# Patient Record
Sex: Female | Born: 1945 | Race: White | Hispanic: Yes | Marital: Married | Smoking: Never smoker
Health system: Southern US, Community
[De-identification: ages and names within clinical notes are randomized; demographics above are authoritative.]

## PROBLEM LIST (undated history)

## (undated) DIAGNOSIS — E785 Hyperlipidemia, unspecified: Secondary | ICD-10-CM

## (undated) DIAGNOSIS — E119 Type 2 diabetes mellitus without complications: Secondary | ICD-10-CM

## (undated) DIAGNOSIS — M5030 Other cervical disc degeneration, unspecified cervical region: Secondary | ICD-10-CM

## (undated) DIAGNOSIS — K5792 Diverticulitis of intestine, part unspecified, without perforation or abscess without bleeding: Secondary | ICD-10-CM

## (undated) DIAGNOSIS — I10 Essential (primary) hypertension: Secondary | ICD-10-CM

## (undated) HISTORY — PX: KNEE SURGERY: SHX244

## (undated) HISTORY — PX: KIDNEY STONE SURGERY: SHX686

---

## 2017-07-05 DIAGNOSIS — G629 Polyneuropathy, unspecified: Secondary | ICD-10-CM

## 2017-07-05 HISTORY — DX: Polyneuropathy, unspecified: G62.9

## 2019-01-19 ENCOUNTER — Ambulatory Visit
Admission: EM | Admit: 2019-01-19 | Discharge: 2019-01-19 | Disposition: A | Discharge status: Home / Self Care | Payer: Medicare Other | Source: Home / Self Care | Attending: Family Medicine | Admitting: Family Medicine

## 2019-01-19 ENCOUNTER — Ambulatory Visit (INDEPENDENT_AMBULATORY_CARE_PROVIDER_SITE_OTHER)
Admit: 2019-01-19 | Discharge: 2019-01-19 | Disposition: A | Discharge status: Home / Self Care | Payer: Medicare Other | Attending: Family Medicine | Admitting: Family Medicine

## 2019-01-19 ENCOUNTER — Other Ambulatory Visit: Payer: Self-pay

## 2019-01-19 ENCOUNTER — Encounter: Payer: Self-pay | Admitting: Emergency Medicine

## 2019-01-19 DIAGNOSIS — Z8249 Family history of ischemic heart disease and other diseases of the circulatory system: Secondary | ICD-10-CM | POA: Diagnosis not present

## 2019-01-19 DIAGNOSIS — M4802 Spinal stenosis, cervical region: Secondary | ICD-10-CM | POA: Diagnosis not present

## 2019-01-19 DIAGNOSIS — I10 Essential (primary) hypertension: Secondary | ICD-10-CM | POA: Diagnosis not present

## 2019-01-19 DIAGNOSIS — R300 Dysuria: Secondary | ICD-10-CM | POA: Diagnosis not present

## 2019-01-19 DIAGNOSIS — Z823 Family history of stroke: Secondary | ICD-10-CM | POA: Diagnosis not present

## 2019-01-19 DIAGNOSIS — Z87442 Personal history of urinary calculi: Secondary | ICD-10-CM | POA: Diagnosis not present

## 2019-01-19 DIAGNOSIS — E785 Hyperlipidemia, unspecified: Secondary | ICD-10-CM | POA: Diagnosis not present

## 2019-01-19 DIAGNOSIS — K5732 Diverticulitis of large intestine without perforation or abscess without bleeding: Secondary | ICD-10-CM | POA: Insufficient documentation

## 2019-01-19 DIAGNOSIS — Z1159 Encounter for screening for other viral diseases: Secondary | ICD-10-CM | POA: Diagnosis not present

## 2019-01-19 DIAGNOSIS — Z882 Allergy status to sulfonamides status: Secondary | ICD-10-CM | POA: Diagnosis not present

## 2019-01-19 DIAGNOSIS — Z803 Family history of malignant neoplasm of breast: Secondary | ICD-10-CM | POA: Diagnosis not present

## 2019-01-19 DIAGNOSIS — R350 Frequency of micturition: Secondary | ICD-10-CM | POA: Diagnosis not present

## 2019-01-19 DIAGNOSIS — R1030 Lower abdominal pain, unspecified: Secondary | ICD-10-CM | POA: Diagnosis not present

## 2019-01-19 DIAGNOSIS — Z833 Family history of diabetes mellitus: Secondary | ICD-10-CM | POA: Diagnosis not present

## 2019-01-19 DIAGNOSIS — Z79899 Other long term (current) drug therapy: Secondary | ICD-10-CM | POA: Diagnosis not present

## 2019-01-19 DIAGNOSIS — R109 Unspecified abdominal pain: Secondary | ICD-10-CM | POA: Diagnosis present

## 2019-01-19 DIAGNOSIS — Z7984 Long term (current) use of oral hypoglycemic drugs: Secondary | ICD-10-CM | POA: Diagnosis not present

## 2019-01-19 DIAGNOSIS — E119 Type 2 diabetes mellitus without complications: Secondary | ICD-10-CM | POA: Diagnosis not present

## 2019-01-19 HISTORY — DX: Type 2 diabetes mellitus without complications: E11.9

## 2019-01-19 HISTORY — DX: Essential (primary) hypertension: I10

## 2019-01-19 LAB — COMPREHENSIVE METABOLIC PANEL
ALT: 15 U/L (ref 0–44)
AST: 16 U/L (ref 15–41)
Albumin: 4.1 g/dL (ref 3.5–5.0)
Alkaline Phosphatase: 87 U/L (ref 38–126)
Anion gap: 8 (ref 5–15)
BUN: 16 mg/dL (ref 8–23)
CO2: 25 mmol/L (ref 22–32)
Calcium: 9 mg/dL (ref 8.9–10.3)
Chloride: 105 mmol/L (ref 98–111)
Creatinine, Ser: 0.63 mg/dL (ref 0.44–1.00)
GFR calc Af Amer: >60 mL/min (ref >60)
GFR calc non Af Amer: >60 mL/min (ref >60)
Glucose, Bld: 142 mg/dL — ABNORMAL HIGH (ref 70–99)
Potassium: 4.1 mmol/L (ref 3.5–5.1)
Sodium: 138 mmol/L (ref 135–145)
Total Bilirubin: 0.7 mg/dL (ref 0.3–1.2)
Total Protein: 7.3 g/dL (ref 6.5–8.1)

## 2019-01-19 LAB — URINALYSIS, COMPLETE (UACMP) WITH MICROSCOPIC
Bacteria, UA: NONE SEEN
Bilirubin Urine: NEGATIVE
Glucose, UA: 500 mg/dL — AB
Hgb urine dipstick: NEGATIVE
Ketones, ur: NEGATIVE mg/dL
Leukocytes,Ua: NEGATIVE
Nitrite: NEGATIVE
Protein, ur: NEGATIVE mg/dL
RBC / HPF: NONE SEEN RBC/hpf (ref 0–5)
Specific Gravity, Urine: 1.025 (ref 1.005–1.030)
WBC, UA: NONE SEEN WBC/hpf (ref 0–5)
pH: 6 (ref 5.0–8.0)

## 2019-01-19 LAB — CBC WITH DIFFERENTIAL/PLATELET
Abs Immature Granulocytes: 0.03 10*3/uL (ref 0.00–0.07)
Basophils Absolute: 0.1 10*3/uL (ref 0.0–0.1)
Basophils Relative: 1 %
Eosinophils Absolute: 0.1 10*3/uL (ref 0.0–0.5)
Eosinophils Relative: 2 %
HCT: 37.5 % (ref 36.0–46.0)
Hemoglobin: 12.5 g/dL (ref 12.0–15.0)
Immature Granulocytes: 0 %
Lymphocytes Relative: 21 %
Lymphs Abs: 1.9 10*3/uL (ref 0.7–4.0)
MCH: 30.8 pg (ref 26.0–34.0)
MCHC: 33.3 g/dL (ref 30.0–36.0)
MCV: 92.4 fL (ref 80.0–100.0)
Monocytes Absolute: 1 10*3/uL (ref 0.1–1.0)
Monocytes Relative: 11 %
Neutro Abs: 5.7 10*3/uL (ref 1.7–7.7)
Neutrophils Relative %: 65 %
Platelets: 207 10*3/uL (ref 150–400)
RBC: 4.06 MIL/uL (ref 3.87–5.11)
RDW: 12.6 % (ref 11.5–15.5)
WBC: 8.8 10*3/uL (ref 4.0–10.5)
nRBC: 0 % (ref 0.0–0.2)

## 2019-01-19 MED ORDER — CIPROFLOXACIN HCL 500 MG PO TABS: 500.0000 mg | ORAL_TABLET | Freq: Two times a day (BID) | ORAL | 0 refills | Status: DC

## 2019-01-19 MED ORDER — IOHEXOL 300 MG/ML  SOLN
100.0000 mL | Freq: Once | INTRAMUSCULAR | Status: AC | PRN
  Administered 2019-01-19: 15:00:00 100 mL via INTRAVENOUS

## 2019-01-19 MED ORDER — METRONIDAZOLE 500 MG PO TABS: 500.0000 mg | ORAL_TABLET | Freq: Three times a day (TID) | ORAL | 0 refills | Status: DC

## 2019-01-19 NOTE — ED Provider Notes (Signed)
[facility]    CSN: [id number] Arrival date & time: 01/19/19  1325  History   Chief Complaint Chief Complaint  Patient presents with  . Abdominal Pain   HPI  73 year old female presents with abdominal pain.  Patient reports a 10-day history of lower abdominal pain.  No known inciting factor.  No fever.  No nausea, vomiting, diarrhea.  She does note urinary frequency and dysuria.  Pain is worse at night and also worse when she bends over and when she walks.  No relieving factors.  Pain is currently mild.  Intermittent.  No medications or interventions tried.  No other associated symptoms.  History reviewed and updated as below.  Past Medical History:  Diagnosis Date  . Diabetes mellitus without complication (HCC)   . Hypertension   HLD  Past Surgical History:  Procedure Laterality Date  . KNEE SURGERY Right   Surgery for Kidney stone  OB History   No obstetric history on file.    Home Medications    Prior to Admission medications   Medication Sig Start Date End Date Taking? Authorizing Provider  lisinopril (ZESTRIL) 5 MG tablet TK 1 T PO ONCE D TO LOWER BLOOD PRESSURE 12/27/18  Yes [provider]  metFORMIN (GLUCOPHAGE) 500 MG tablet TK 1 T PO QD 12/27/18  Yes [provider]  simvastatin (ZOCOR) 20 MG tablet Take by mouth.   Yes [provider]  ciprofloxacin (CIPRO) 500 MG tablet Take 1 tablet (500 mg total) by mouth 2 (two) times daily for 7 days. 01/19/19 01/26/19  Tommie Samsook, Creedence Heiss G, DO  metroNIDAZOLE (FLAGYL) 500 MG tablet Take 1 tablet (500 mg total) by mouth 3 (three) times daily for 7 days. 01/19/19 01/26/19  Tommie Samsook, Lititia Sen G, DO   Social History Social History   Tobacco Use  . Smoking status: Never Smoker  . Smokeless tobacco: Never Used  Substance Use Topics  . Alcohol use: Never    Frequency: Never  . Drug use: Never     Allergies   Sulfa antibiotics   Review of Systems Review of Systems  Constitutional: Negative for  fever.  Gastrointestinal: Negative for constipation, diarrhea, nausea and vomiting.  Genitourinary: Positive for dysuria and frequency.   Physical Exam Triage Vital Signs ED Triage Vitals  Enc Vitals Group     BP 01/19/19 1350 (!) 152/78     Pulse Rate 01/19/19 1350 68     Resp 01/19/19 1350 16     Temp 01/19/19 1350 98.3 F (36.8 C)     Temp Source 01/19/19 1350 Oral     SpO2 01/19/19 1350 98 %     Weight 01/19/19 1345 160 lb (72.6 kg)     Height 01/19/19 1345 5\' 2"  (1.575 m)     Head Circumference --      Peak Flow --      Pain Score 01/19/19 1345 2     Pain Loc --      Pain Edu? --      Excl. in GC? --    Updated Vital Signs BP (!) 152/78 (BP Location: Right Arm)   Pulse 68   Temp 98.3 F (36.8 C) (Oral)   Resp 16   Ht 5\' 2"  (1.575 m)   Wt 72.6 kg   SpO2 98%   BMI 29.26 kg/m   Visual Acuity Right Eye Distance:   Left Eye Distance:   Bilateral Distance:    Right Eye Near:   Left Eye Near:  Bilateral Near:     Physical Exam Vitals signs and nursing note reviewed.  Constitutional:      General: She is not in acute distress.    Appearance: She is well-developed. She is not ill-appearing.  HENT:     Head: Normocephalic and atraumatic.  Eyes:     General:        Right eye: No discharge.        Left eye: No discharge.     Conjunctiva/sclera: Conjunctivae normal.  Cardiovascular:     Rate and Rhythm: Normal rate and regular rhythm.     Heart sounds: Murmur present.  Pulmonary:     Effort: Pulmonary effort is normal.     Breath sounds: Normal breath sounds.  Abdominal:     Tenderness: There is no rebound.     Comments: Soft, nondistended.  Scar to the right of midline noted.  Tender to palpation in the lower abdomen, most notably the left lower quadrant.  Neurological:     Mental Status: She is alert.  Psychiatric:        Mood and Affect: Mood normal.        Behavior: Behavior normal.    UC Treatments / Results  Labs (all labs ordered are listed,  but only abnormal results are displayed) Labs Reviewed  URINALYSIS, COMPLETE (UACMP) WITH MICROSCOPIC - Abnormal; Notable for the following components:      Result Value   Glucose, UA 500 (*)    All other components within normal limits  COMPREHENSIVE METABOLIC PANEL - Abnormal; Notable for the following components:   Glucose, Bld 142 (*)    All other components within normal limits  CBC WITH DIFFERENTIAL/PLATELET    EKG   Radiology Ct Abdomen Pelvis W Contrast  Result Date: 01/19/2019 CLINICAL DATA:  Lower abdominal pain over the last 2 weeks. Concern regarding diverticulitis. EXAM: CT ABDOMEN AND PELVIS WITH CONTRAST TECHNIQUE: Multidetector CT imaging of the abdomen and pelvis was performed using the standard protocol following bolus administration of intravenous contrast. CONTRAST:  117mL OMNIPAQUE IOHEXOL 300 MG/ML  SOLN COMPARISON:  None. FINDINGS: Lower chest: Mild scarring at the lung bases. No pleural fluid. 1.6 cm pleural based nodule at the right posterior pleural surface. This is nonspecific but most likely to be benign. Pleural tumor not absolutely excluded. Is there prior imaging of the chest? If not, one might consider a dedicated chest CT as an elective procedure for further evaluation. Hepatobiliary: Fatty change of the liver. Previous cholecystectomy. 1 cm cyst or hemangioma at the dome. Pancreas: Normal Spleen: Normal Adrenals/Urinary Tract: Adrenal glands are normal. Kidneys are normal. Bladder is normal. Stomach/Bowel: No evidence of ileus or obstruction. No small bowel pathology is seen. Clip in the right lower quadrant consistent with previous appendectomy. Patient has diverticulosis of the sigmoid colon with low level diverticulitis of the proximal sigmoid region. No abscess or free air. Vascular/Lymphatic: Aortic atherosclerosis. No aneurysm. IVC is normal. No retroperitoneal adenopathy. Reproductive: Normal.  No pelvic mass. Other: No free fluid or air. Musculoskeletal:  Chronic pars defects and facet arthropathy at L5-S1 with anterolisthesis of 1 cm and chronic disc degeneration. L4-5 facet arthropathy with 5 mm of anterolisthesis and bulging of the disc. IMPRESSION: Diverticulosis of the sigmoid colon with low level diverticulitis at the proximal sigmoid region. No advanced finding. No abscess or free air. Advanced lower lumbar disc degeneration, facet degeneration and L5 pars defects. Fatty liver. Previous cholecystectomy and hysterectomy. 1.6 cm pleural based nodule at the diaphragmatic surface  on the right. I favor that this represents a benign pleural nodule, but that diagnosis is not certain. Consider complete chest CT with contrast if there is not an old study elsewhere documenting stability. Electronically Signed   By: Paulina FusiMark  Shogry M.D.   On: 01/19/2019 15:38    Procedures Procedures (including critical care time)  Medications Ordered in UC Medications - No data to display  Initial Impression / Assessment and Plan / UC Course  I have reviewed the triage vital signs and the nursing notes.  Pertinent labs & imaging results that were available during my care of the patient were reviewed by me and considered in my medical decision making (see chart for details).    73 year old female presents with lower abdominal pain.  Urinalysis not consistent with UTI.  Left lower quadrant tenderness on exam.  Discussed with the patient and her daughter that I suspect diverticulitis.  Discussed CT imaging and patient wanted to proceed with imaging to confirm diagnosis.  CT revealed low-level diverticulitis of the sigmoid colon.  Treating with Cipro and Flagyl.  Final Clinical Impressions(s) / UC Diagnoses   Final diagnoses:  Diverticulitis of colon   Discharge Instructions   None    ED Prescriptions    Medication Sig Dispense Auth. Provider   ciprofloxacin (CIPRO) 500 MG tablet Take 1 tablet (500 mg total) by mouth 2 (two) times daily for 7 days. 14 tablet Aqeel Norgaard,  Amanii Snethen G, DO   metroNIDAZOLE (FLAGYL) 500 MG tablet Take 1 tablet (500 mg total) by mouth 3 (three) times daily for 7 days. 21 tablet Tommie Samsook, Kyion Gautier G, DO     Controlled Substance Prescriptions [place] Controlled Substance Registry consulted? Not Applicable   Tommie SamsCook, Toshiko Kemler G, DO 01/19/19 1605

## 2019-01-19 NOTE — ED Notes (Signed)
Received prior authorization for patient CPT code [phone]. Authorization # [id number] obtained from [facility]. [facility]

## 2019-01-19 NOTE — ED Triage Notes (Signed)
Patient c/o lower abdominal pain that started 10 days ago.  Patient states pain is worse when she walks or bends over.  Patient denies N/V/D.  Patient reports increase in urinary frequency and some dysuria. Patient also reports numbness and tingling in her right arm for the past 3 months.

## 2019-01-22 ENCOUNTER — Inpatient Hospital Stay: Payer: Medicare Other

## 2019-01-22 ENCOUNTER — Other Ambulatory Visit: Payer: Self-pay

## 2019-01-22 ENCOUNTER — Inpatient Hospital Stay
Admission: EM | Admit: 2019-01-22 | Discharge: 2019-01-24 | DRG: 392 | Disposition: A | Discharge status: Home / Self Care | Payer: Medicare Other | Attending: Internal Medicine | Admitting: Internal Medicine

## 2019-01-22 ENCOUNTER — Emergency Department: Payer: Medicare Other

## 2019-01-22 DIAGNOSIS — Z823 Family history of stroke: Secondary | ICD-10-CM | POA: Diagnosis not present

## 2019-01-22 DIAGNOSIS — E785 Hyperlipidemia, unspecified: Secondary | ICD-10-CM | POA: Diagnosis present

## 2019-01-22 DIAGNOSIS — Z803 Family history of malignant neoplasm of breast: Secondary | ICD-10-CM

## 2019-01-22 DIAGNOSIS — R300 Dysuria: Secondary | ICD-10-CM | POA: Diagnosis not present

## 2019-01-22 DIAGNOSIS — R350 Frequency of micturition: Secondary | ICD-10-CM | POA: Diagnosis present

## 2019-01-22 DIAGNOSIS — Z87442 Personal history of urinary calculi: Secondary | ICD-10-CM | POA: Diagnosis not present

## 2019-01-22 DIAGNOSIS — M4802 Spinal stenosis, cervical region: Secondary | ICD-10-CM | POA: Diagnosis not present

## 2019-01-22 DIAGNOSIS — Z833 Family history of diabetes mellitus: Secondary | ICD-10-CM

## 2019-01-22 DIAGNOSIS — Z79899 Other long term (current) drug therapy: Secondary | ICD-10-CM | POA: Diagnosis not present

## 2019-01-22 DIAGNOSIS — Z8249 Family history of ischemic heart disease and other diseases of the circulatory system: Secondary | ICD-10-CM

## 2019-01-22 DIAGNOSIS — Z7984 Long term (current) use of oral hypoglycemic drugs: Secondary | ICD-10-CM | POA: Diagnosis not present

## 2019-01-22 DIAGNOSIS — Z1159 Encounter for screening for other viral diseases: Secondary | ICD-10-CM | POA: Diagnosis not present

## 2019-01-22 DIAGNOSIS — R109 Unspecified abdominal pain: Secondary | ICD-10-CM | POA: Diagnosis not present

## 2019-01-22 DIAGNOSIS — I10 Essential (primary) hypertension: Secondary | ICD-10-CM | POA: Diagnosis not present

## 2019-01-22 DIAGNOSIS — E119 Type 2 diabetes mellitus without complications: Secondary | ICD-10-CM | POA: Diagnosis present

## 2019-01-22 DIAGNOSIS — Z882 Allergy status to sulfonamides status: Secondary | ICD-10-CM | POA: Diagnosis not present

## 2019-01-22 DIAGNOSIS — K5732 Diverticulitis of large intestine without perforation or abscess without bleeding: Secondary | ICD-10-CM | POA: Diagnosis not present

## 2019-01-22 DIAGNOSIS — K5792 Diverticulitis of intestine, part unspecified, without perforation or abscess without bleeding: Secondary | ICD-10-CM | POA: Diagnosis present

## 2019-01-22 HISTORY — DX: Diverticulitis of intestine, part unspecified, without perforation or abscess without bleeding: K57.92

## 2019-01-22 HISTORY — DX: Hyperlipidemia, unspecified: E78.5

## 2019-01-22 LAB — CBC
HCT: 38.3 % (ref 36.0–46.0)
Hemoglobin: 12.8 g/dL (ref 12.0–15.0)
MCH: 30.5 pg (ref 26.0–34.0)
MCHC: 33.4 g/dL (ref 30.0–36.0)
MCV: 91.2 fL (ref 80.0–100.0)
Platelets: 214 10*3/uL (ref 150–400)
RBC: 4.2 MIL/uL (ref 3.87–5.11)
RDW: 12.6 % (ref 11.5–15.5)
WBC: 12.1 10*3/uL — ABNORMAL HIGH (ref 4.0–10.5)
nRBC: 0 % (ref 0.0–0.2)

## 2019-01-22 LAB — URINALYSIS, COMPLETE (UACMP) WITH MICROSCOPIC
Bacteria, UA: NONE SEEN
Bilirubin Urine: NEGATIVE
Glucose, UA: NEGATIVE mg/dL
Hgb urine dipstick: NEGATIVE
Ketones, ur: NEGATIVE mg/dL
Nitrite: NEGATIVE
Protein, ur: NEGATIVE mg/dL
Specific Gravity, Urine: 1.003 — ABNORMAL LOW (ref 1.005–1.030)
Squamous Epithelial / HPF: NONE SEEN (ref 0–5)
pH: 6 (ref 5.0–8.0)

## 2019-01-22 LAB — GLUCOSE, CAPILLARY
Glucose-Capillary: 118 mg/dL — ABNORMAL HIGH (ref 70–99)
Glucose-Capillary: 208 mg/dL — ABNORMAL HIGH (ref 70–99)

## 2019-01-22 LAB — SARS CORONAVIRUS 2 BY RT PCR (HOSPITAL ORDER, PERFORMED IN ~~LOC~~ HOSPITAL LAB): SARS Coronavirus 2: NEGATIVE

## 2019-01-22 LAB — COMPREHENSIVE METABOLIC PANEL
ALT: 17 U/L (ref 0–44)
AST: 17 U/L (ref 15–41)
Albumin: 4.5 g/dL (ref 3.5–5.0)
Alkaline Phosphatase: 83 U/L (ref 38–126)
Anion gap: 9 (ref 5–15)
BUN: 11 mg/dL (ref 8–23)
CO2: 24 mmol/L (ref 22–32)
Calcium: 9.3 mg/dL (ref 8.9–10.3)
Chloride: 106 mmol/L (ref 98–111)
Creatinine, Ser: 0.74 mg/dL (ref 0.44–1.00)
GFR calc Af Amer: >60 mL/min (ref >60)
GFR calc non Af Amer: >60 mL/min (ref >60)
Glucose, Bld: 164 mg/dL — ABNORMAL HIGH (ref 70–99)
Potassium: 4.2 mmol/L (ref 3.5–5.1)
Sodium: 139 mmol/L (ref 135–145)
Total Bilirubin: 0.7 mg/dL (ref 0.3–1.2)
Total Protein: 7.5 g/dL (ref 6.5–8.1)

## 2019-01-22 LAB — LIPASE, BLOOD: Lipase: 30 U/L (ref 11–51)

## 2019-01-22 MED ORDER — LISINOPRIL 10 MG PO TABS
5.0000 mg | ORAL_TABLET | Freq: Every day | ORAL | Status: DC
  Filled 2019-01-22: qty 1

## 2019-01-22 MED ORDER — ONDANSETRON HCL 4 MG/2ML IJ SOLN
4.0000 mg | Freq: Once | INTRAMUSCULAR | Status: AC
  Administered 2019-01-22: 4 mg via INTRAVENOUS
  Filled 2019-01-22: qty 2

## 2019-01-22 MED ORDER — IOHEXOL 240 MG/ML SOLN
25.0000 mL | INTRAMUSCULAR | Status: AC
  Administered 2019-01-22: 25 mL via ORAL

## 2019-01-22 MED ORDER — PIPERACILLIN-TAZOBACTAM 3.375 G IVPB 30 MIN
3.3750 g | Freq: Once | INTRAVENOUS | Status: AC
  Administered 2019-01-22: 3.375 g via INTRAVENOUS
  Filled 2019-01-22: qty 50

## 2019-01-22 MED ORDER — IOHEXOL 300 MG/ML  SOLN
100.0000 mL | Freq: Once | INTRAMUSCULAR | Status: AC | PRN
  Administered 2019-01-22: 100 mL via INTRAVENOUS

## 2019-01-22 MED ORDER — FENTANYL CITRATE (PF) 100 MCG/2ML IJ SOLN
50.0000 ug | Freq: Once | INTRAMUSCULAR | Status: AC
  Administered 2019-01-22: 50 ug via INTRAVENOUS
  Filled 2019-01-22: qty 2

## 2019-01-22 MED ORDER — PIPERACILLIN-TAZOBACTAM 3.375 G IVPB
3.3750 g | Freq: Three times a day (TID) | INTRAVENOUS | Status: DC
  Administered 2019-01-23 – 2019-01-24 (×4): 3.375 g via INTRAVENOUS
  Filled 2019-01-22 (×4): qty 50

## 2019-01-22 MED ORDER — ACETAMINOPHEN 325 MG PO TABS
650.0000 mg | ORAL_TABLET | Freq: Four times a day (QID) | ORAL | Status: DC | PRN
  Administered 2019-01-23 (×2): 650 mg via ORAL
  Filled 2019-01-22 (×2): qty 2

## 2019-01-22 MED ORDER — ONDANSETRON HCL 4 MG/2ML IJ SOLN: 4.0000 mg | Freq: Four times a day (QID) | INTRAMUSCULAR | Status: DC | PRN

## 2019-01-22 MED ORDER — ENOXAPARIN SODIUM 40 MG/0.4ML ~~LOC~~ SOLN
40.0000 mg | SUBCUTANEOUS | Status: DC
  Administered 2019-01-22 – 2019-01-23 (×2): 40 mg via SUBCUTANEOUS
  Filled 2019-01-22 (×2): qty 0.4

## 2019-01-22 MED ORDER — OXYCODONE HCL 5 MG PO TABS: 5.0000 mg | ORAL_TABLET | ORAL | Status: DC | PRN

## 2019-01-22 MED ORDER — SODIUM CHLORIDE 0.9 % IV BOLUS
1000.0000 mL | Freq: Once | INTRAVENOUS | Status: AC
  Administered 2019-01-22: 1000 mL via INTRAVENOUS

## 2019-01-22 MED ORDER — ALBUTEROL SULFATE (2.5 MG/3ML) 0.083% IN NEBU: 3.0000 mL | INHALATION_SOLUTION | RESPIRATORY_TRACT | Status: DC | PRN

## 2019-01-22 MED ORDER — SIMVASTATIN 20 MG PO TABS
20.0000 mg | ORAL_TABLET | Freq: Every day | ORAL | Status: DC
  Administered 2019-01-23: 20 mg via ORAL
  Filled 2019-01-22 (×2): qty 1

## 2019-01-22 MED ORDER — METFORMIN HCL 500 MG PO TABS
500.0000 mg | ORAL_TABLET | Freq: Every day | ORAL | Status: DC
  Administered 2019-01-23 – 2019-01-24 (×2): 500 mg via ORAL
  Filled 2019-01-22 (×2): qty 1

## 2019-01-22 MED ORDER — LORAZEPAM 2 MG/ML IJ SOLN
0.5000 mg | Freq: Once | INTRAMUSCULAR | Status: AC | PRN
  Administered 2019-01-22: 0.5 mg via INTRAVENOUS
  Filled 2019-01-22: qty 1

## 2019-01-22 MED ORDER — ACETAMINOPHEN 650 MG RE SUPP: 650.0000 mg | Freq: Four times a day (QID) | RECTAL | Status: DC | PRN

## 2019-01-22 MED ORDER — ONDANSETRON HCL 4 MG PO TABS: 4.0000 mg | ORAL_TABLET | Freq: Four times a day (QID) | ORAL | Status: DC | PRN

## 2019-01-22 NOTE — H&P (Signed)
[place] [facility] [place] NAME: Pamela Rios    MR#:  [id number]  DATE OF BIRTH:  06-24-1946  DATE OF ADMISSION:  01/22/2019  PRIMARY CARE PHYSICIAN: Patient, No Pcp Per   REQUESTING/REFERRING PHYSICIAN: Dr Charlotte Crumb  CHIEF COMPLAINT:   Chief Complaint  Patient presents with  . Abdominal Pain  . Dizziness    HISTORY OF PRESENT ILLNESS:  Pamela Rios  is a 73 y.o. female with recently diagnosed diverticulitis.  Patient is having 2 weeks of abdominal pain in the left lower quadrant.  She went to the urgent care on Friday was diagnosed with diverticulitis and given Cipro and Flagyl.  The pain kept on getting worse and worse and no relief.  Pain was 1010 when she came in now 7 out of 10 after some IV medications.  She did vomit today.  Some chills.  Some nausea.  Normal bowel movements.  No diarrhea or constipation.  No fever.  In the ER she still had diverticulitis on CT scan and hospitalist services were contacted for further evaluation.  Of note patient has had right arm numbness for the past 2 months worse with movement of her arm and neck.  PAST MEDICAL HISTORY:   Past Medical History:  Diagnosis Date  . Diabetes mellitus without complication ([place])   . Diverticulitis   . Hyperlipidemia   . Hypertension     PAST SURGICAL HISTORY:   Past Surgical History:  Procedure Laterality Date  . KIDNEY STONE SURGERY    . KNEE SURGERY Right     SOCIAL HISTORY:   Social History   Tobacco Use  . Smoking status: Never Smoker  . Smokeless tobacco: Never Used  Substance Use Topics  . Alcohol use: Never    Frequency: Never    FAMILY HISTORY:   Family History  Problem Relation Age of Onset  . Breast cancer Mother   . CVA Mother   . Diabetes Mother   . Hypertension Mother   . CAD Father     DRUG ALLERGIES:   Allergies  Allergen Reactions  . Sulfa Antibiotics Rash    REVIEW OF SYSTEMS:  CONSTITUTIONAL: No fever,  positive for chills and sweats.  Positive for weight gain.  Positive for fatigue.  EYES: No blurred or double vision.  EARS, NOSE, AND THROAT: No tinnitus or ear pain. No sore throat RESPIRATORY: No cough, shortness of breath, wheezing or hemoptysis.  CARDIOVASCULAR: No chest pain, orthopnea, edema.  GASTROINTESTINAL: No nausea, vomiting, and abdominal pain. No blood in bowel movements.  No diarrhea or constipation. GENITOURINARY: No dysuria, hematuria.  ENDOCRINE: No polyuria, nocturia,  HEMATOLOGY: No anemia, easy bruising or bleeding SKIN: No rash or lesion. MUSCULOSKELETAL: No joint pain or arthritis.   NEUROLOGIC: Right arm numbness PSYCHIATRY: No anxiety or depression.   MEDICATIONS AT HOME:   Prior to Admission medications   Medication Sig Start Date End Date Taking? Authorizing Provider  albuterol (VENTOLIN HFA) 108 (90 Base) MCG/ACT inhaler Inhale 2 puffs into the lungs every 4 (four) hours as needed for wheezing. 08/30/18  Yes [provider]  ciprofloxacin (CIPRO) 500 MG tablet Take 1 tablet (500 mg total) by mouth 2 (two) times daily for 7 days. 01/19/19 01/26/19 Yes Cook, Jayce G, DO  lisinopril (ZESTRIL) 5 MG tablet Take 5 mg by mouth daily.  12/27/18  Yes [provider]  metFORMIN (GLUCOPHAGE) 500 MG tablet Take 500 mg by mouth daily with breakfast.  12/27/18  Yes  [provider]  metroNIDAZOLE (FLAGYL) 500 MG tablet Take 1 tablet (500 mg total) by mouth 3 (three) times daily for 7 days. 01/19/19 01/26/19 Yes Cook, Jayce G, DO  simvastatin (ZOCOR) 20 MG tablet Take 20 mg by mouth daily at 6 PM.    Yes [provider]      VITAL SIGNS:  Blood pressure (!) 146/65, pulse 61, temperature 98.9 F (37.2 C), temperature source Oral, resp. rate 16, height 5\' 2"  (1.575 m), weight 72.6 kg, SpO2 98 %.  PHYSICAL EXAMINATION:  GENERAL:  73 y.o.-year-old patient lying in the bed with no acute distress.  EYES: Pupils equal, round, reactive to light and  accommodation. No scleral icterus. Extraocular muscles intact.  HEENT: Head atraumatic, normocephalic. Oropharynx and nasopharynx clear.  NECK:  Supple, no jugular venous distention. No thyroid enlargement, no tenderness.  LUNGS: Normal breath sounds bilaterally, no wheezing, rales,rhonchi or crepitation. No use of accessory muscles of respiration.  CARDIOVASCULAR: S1, S2 normal. No murmurs, rubs, or gallops.  ABDOMEN: Soft, nontender, nondistended. Bowel sounds present. No organomegaly or mass.  EXTREMITIES: No pedal edema, cyanosis, or clubbing.  NEUROLOGIC: Cranial nerves II through XII are intact. Muscle strength 5/5 in all extremities. Sensation intact. Gait not checked.  PSYCHIATRIC: The patient is alert and oriented x 3.  SKIN: No rash, lesion, or ulcer.   LABORATORY PANEL:   CBC Recent Labs  Lab 01/22/19 1458  WBC 12.1*  HGB 12.8  HCT 38.3  PLT 214   ------------------------------------------------------------------------------------------------------------------  Chemistries  Recent Labs  Lab 01/22/19 1458  NA 139  K 4.2  CL 106  CO2 24  GLUCOSE 164*  BUN 11  CREATININE 0.74  CALCIUM 9.3  AST 17  ALT 17  ALKPHOS 83  BILITOT 0.7   ------------------------------------------------------------------------------------------------------------------  RADIOLOGY:  Ct Abdomen Pelvis W Contrast  Result Date: 01/22/2019 CLINICAL DATA:  73 year old with chronic LEFT LOWER QUADRANT abdominal pain and diagnosis of low-grade acute diverticulitis 3 days ago, presenting now with worsening pain despite antibiotic therapy with ciprofloxacin and Flagyl. EXAM: CT ABDOMEN AND PELVIS WITH CONTRAST TECHNIQUE: Multidetector CT imaging of the abdomen and pelvis was performed using the standard protocol following bolus administration of intravenous contrast. CONTRAST:  100mL OMNIPAQUE IOHEXOL 300 MG/ML IV. Oral contrast was also administered. COMPARISON:  01/19/2019. FINDINGS: Lower chest:  Mild dependent atelectasis in the lower lobes. Visualized lung bases otherwise clear. Heart moderately enlarged, unchanged. Hepatobiliary: Mild diffuse hepatic steatosis as noted 3 days ago. A focal eventration of the RIGHT POSTERIOR hemidiaphragm is favored over a pleural nodule at the base of the RIGHT hemithorax. Benign cyst involving the MEDIAL segment LEFT lobe of liver. No significant focal hepatic parenchymal abnormality. Surgically absent gallbladder. No unexpected biliary ductal dilation. Pancreas: Normal in appearance without evidence of mass, ductal dilation, or inflammation. Spleen: Normal in size and appearance. Adrenals/Urinary Tract: Normal appearing adrenal glands. Kidneys normal in size and appearance without focal parenchymal abnormality. No hydronephrosis. No evidence of urinary tract calculi. Normal appearing urinary bladder. Stomach/Bowel: Stomach normal in appearance for the degree of distention. Normal-appearing small bowel. Interval progression of acute inflammatory changes involving the proximal sigmoid colon since the CT 3 days ago. No extraluminal gas or abnormal fluid collection. No new areas of diverticulitis. Surgical clip at the cecal tip presumably related to prior appendectomy. Vascular/Lymphatic: Mild aortoiliac atherosclerosis without evidence of aneurysm. Normal-appearing portal venous and systemic venous systems. No pathologic lymphadenopathy. Reproductive: Normal-appearing uterus and ovaries without evidence of adnexal mass. Other: Numerous pelvic phleboliths.  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 BILATERAL L5 pars defects and grade 2 spondylolisthesis of L5 on S1 measuring approximately 9 mm. Facet degenerative changes at L4-5 and L5-S1. Degenerative disc disease and spondylosis involving the LOWER thoracic spine. No acute findings. IMPRESSION: 1. Mild interval progression of acute diverticulitis involving the proximal sigmoid colon since  the CT 3 days ago. No evidence of perforation or abscess. 2. No acute abnormalities otherwise involving the abdomen or pelvis. 3. Mild diffuse hepatic steatosis. 4. BILATERAL L5 pars defects and grade 2 spondylolisthesis of L5 on S1 measuring approximately 9 mm. Aortic Atherosclerosis (ICD10-I70.0). Electronically Signed   By: Hulan Saashomas  Lawrence M.D.   On: 01/22/2019 19:38    EKG:   Normal sinus rhythm 82 bpm, left axis deviation left bundle branch block  IMPRESSION AND PLAN:   1.  Acute diverticulitis failed outpatient treatment treatment with Cipro and Flagyl.  We will give IV Zosyn.  Pain control.  Nausea control. 2.  Numbness of the right arm.  We will get a MRI of the cervical spine and brain to rule out stroke or herniated disc. 3.  Type 2 diabetes mellitus on Glucophage and sliding scale 4.  Hypertension on low-dose lisinopril 5.  Hyperlipidemia unspecified on Zocor  All the records are reviewed and case discussed with ED provider. Management plans discussed with the patient, family and they are in agreement.  CODE STATUS: Full code  TOTAL TIME TAKING CARE OF THIS PATIENT: 50 minutes.    Alford Highlandichard Harlo Fabela M.D on 01/22/2019 at 8:34 PM  Between 7am to 6pm - Pager - [phone]  After 6pm call admission pager [phone]  [facility]  [phone]  CC: Primary care physician; Patient, No Pcp Per

## 2019-01-22 NOTE — ED Notes (Signed)
Sandwich tray was given to pt.

## 2019-01-22 NOTE — ED Notes (Signed)
Patient had large, bile-colored emesis. Dr. Burlene Arnt aware.

## 2019-01-22 NOTE — ED Triage Notes (Addendum)
C/o lower abdominal pain X 2 weeks, went to Urgent Care on Friday and dx with diverticulitis- given prescriptions for flagyl and cipro. Reports dysuria and pain with urination. Reports feeling hot and dizziness. Pt alert and oriented X4, active, cooperative, pt in NAD. RR even and unlabored, color WNL.

## 2019-01-22 NOTE — Consult Note (Signed)
Pharmacy Antibiotic Note  Pamela Rios is a 73 y.o. female admitted on 01/22/2019 with Intra-abdominal Infection. It was noted that patient had a large, bile-colored emesis. Zosyn was given in the ED today @ 1717.  Pharmacy has been consulted for Zosyn dosing.    Plan: Zosyn 3.375 g Q8H (extended infusion) - next dose due @ 0100  Height: 5\' 2"  (157.5 cm) Weight: 160 lb (72.6 kg) IBW/kg (Calculated) : 50.1  Temp (24hrs), Avg:98.9 F (37.2 C), Min:98.9 F (37.2 C), Max:98.9 F (37.2 C)  Recent Labs  Lab 01/19/19 1453 01/22/19 1458  WBC 8.8 12.1*  CREATININE 0.63 0.74    Estimated Creatinine Clearance: 61 mL/min (by C-G formula based on SCr of 0.74 mg/dL).    Allergies  Allergen Reactions  . Sulfa Antibiotics Rash    Antimicrobials this admission: 07/20 Zosyn >>  Dose adjustments this admission: N/A  Microbiology results: N/A  Thank you for allowing pharmacy to be a part of this patient's care.  Rowland Lathe 01/22/2019 8:34 PM

## 2019-01-22 NOTE — ED Provider Notes (Signed)
[facility] Emergency Department Provider Note  ____________________________________________   I have reviewed the triage vital signs and the nursing notes. Where available I have reviewed prior notes and, if possible and indicated, outside hospital notes.   Patient seen and evaluated during the coronavirus epidemic during a time with low staffing HISTORY  Chief Complaint Abdominal Pain and Dizziness    HPI Pamela Rios is a 73 y.o. female  History of diabetes mellitus and hypertension, diagnosed on the 17th of this month with diverticulitis after several weeks of abdominal pain on the left side.  No melena no bright red blood per rectum, no vomiting.  Positive nausea.  Feels warm but no fever.  States the pain is getting worse and not better despite the fact that she is on Cipro and Flagyl since Friday.  She states the pain is keeping her up at night.  Is worse when she moves around.  No other alleviating or aggravating features no other complaints except for she does have a sense that when she really has to urinate it makes the pain worse but she does not have dysuria per se      Past Medical History:  Diagnosis Date  . Diabetes mellitus without complication (HCC)   . Diverticulitis   . Hypertension     There are no active problems to display for this patient.   Past Surgical History:  Procedure Laterality Date  . KNEE SURGERY Right     Prior to Admission medications   Medication Sig Start Date End Date Taking? Authorizing Provider  ciprofloxacin (CIPRO) 500 MG tablet Take 1 tablet (500 mg total) by mouth 2 (two) times daily for 7 days. 01/19/19 01/26/19  Tommie Samsook, Jayce G, DO  lisinopril (ZESTRIL) 5 MG tablet TK 1 T PO ONCE D TO LOWER BLOOD PRESSURE 12/27/18   [provider]  metFORMIN (GLUCOPHAGE) 500 MG tablet TK 1 T PO QD 12/27/18   [provider]  metroNIDAZOLE (FLAGYL) 500 MG tablet Take 1 tablet (500 mg total) by mouth 3 (three)  times daily for 7 days. 01/19/19 01/26/19  Tommie Samsook, Jayce G, DO  simvastatin (ZOCOR) 20 MG tablet Take by mouth.    [provider]    Allergies Sulfa antibiotics  No family history on file.  Social History Social History   Tobacco Use  . Smoking status: Never Smoker  . Smokeless tobacco: Never Used  Substance Use Topics  . Alcohol use: Never    Frequency: Never  . Drug use: Never    Review of Systems Constitutional: No fever/chills Eyes: No visual changes. ENT: No sore throat. No stiff neck no neck pain Cardiovascular: Denies chest pain. Respiratory: Denies shortness of breath. Gastrointestinal:   no vomiting.  No diarrhea.  No constipation. Genitourinary: Negative for dysuria. Musculoskeletal: Negative lower extremity swelling Skin: Negative for rash. Neurological: Negative for severe headaches, focal weakness or numbness.   ____________________________________________   PHYSICAL EXAM:  VITAL SIGNS: ED Triage Vitals  Enc Vitals Group     BP 01/22/19 1449 (!) 153/74     Pulse Rate 01/22/19 1449 81     Resp 01/22/19 1449 18     Temp 01/22/19 1449 98.9 F (37.2 C)     Temp Source 01/22/19 1449 Oral     SpO2 01/22/19 1449 95 %     Weight 01/22/19 1450 160 lb (72.6 kg)     Height 01/22/19 1450 5\' 2"  (1.575 m)     Head Circumference --  Peak Flow --      Pain Score 01/22/19 1453 9     Pain Loc --      Pain Edu? --      Excl. in GC? --     Constitutional: Alert and oriented. Well appearing and in no acute distress. Eyes: Conjunctivae are normal Head: Atraumatic HEENT: No congestion/rhinnorhea. Mucous membranes are moist.  Oropharynx non-erythematous Neck:   Nontender with no meningismus, no masses, no stridor Cardiovascular: Normal rate, regular rhythm. Grossly normal heart sounds.  Good peripheral circulation. Respiratory: Normal respiratory effort.  No retractions. Lungs CTAB. Abdominal: Soft and tenderness palpation left lower quadrant, positive  guarding, no rebound yet. No distention.Back:  There is no focal tenderness or step off.  there is no midline tenderness there are no lesions noted. there is no CVA tenderness Musculoskeletal: No lower extremity tenderness, no upper extremity tenderness. No joint effusions, no DVT signs strong distal pulses no edema Neurologic:  Normal speech and language. No gross focal neurologic deficits are appreciated.  Skin:  Skin is warm, dry and intact. No rash noted. Psychiatric: Mood and affect are normal. Speech and behavior are normal.  ____________________________________________   LABS (all labs ordered are listed, but only abnormal results are displayed)  Labs Reviewed  COMPREHENSIVE METABOLIC PANEL - Abnormal; Notable for the following components:      Result Value   Glucose, Bld 164 (*)    All other components within normal limits  CBC - Abnormal; Notable for the following components:   WBC 12.1 (*)    All other components within normal limits  URINALYSIS, COMPLETE (UACMP) WITH MICROSCOPIC - Abnormal; Notable for the following components:   Color, Urine STRAW (*)    APPearance CLEAR (*)    Specific Gravity, Urine 1.003 (*)    Leukocytes,Ua TRACE (*)    All other components within normal limits  LIPASE, BLOOD    Pertinent labs  results that were available during my care of the patient were reviewed by me and considered in my medical decision making (see chart for details). ____________________________________________  EKG  I personally interpreted any EKGs ordered by me or triage  ____________________________________________  RADIOLOGY  Pertinent labs & imaging results that were available during my care of the patient were reviewed by me and considered in my medical decision making (see chart for details). If possible, patient and/or family made aware of any abnormal findings.  No results found. ____________________________________________    PROCEDURES  Procedure(s)  performed: None  Procedures  Critical Care performed: None  ____________________________________________   INITIAL IMPRESSION / ASSESSMENT AND PLAN / ED COURSE  Pertinent labs & imaging results that were available during my care of the patient were reviewed by me and considered in my medical decision making (see chart for details).  Seen with left lower quadrant abdominal pain, known history of diverticulitis however getting worse and not better with a white count going up and increased pain with very focal and not in significant discomfort, my concern is for either perforation or abscess, we will obtain repeat CT scan with some reluctance.  Blood work is reassuring.  I will also administer IV Zosyn as we do know what the underlying pathology appears to be, and we will give her pain medication and IV fluid and reassess    ____________________________________________   FINAL CLINICAL IMPRESSION(S) / ED DIAGNOSES  Final diagnoses:  None      This chart was dictated using voice recognition software.  Despite best efforts to  proofread,  errors can occur which can change meaning.        Schuyler Amor, MD 01/22/19 [phone]

## 2019-01-22 NOTE — ED Notes (Signed)
Patient taken to MRI

## 2019-01-22 NOTE — ED Notes (Signed)
Patient taken to CT scan.

## 2019-01-23 DIAGNOSIS — R109 Unspecified abdominal pain: Secondary | ICD-10-CM | POA: Diagnosis not present

## 2019-01-23 DIAGNOSIS — K5732 Diverticulitis of large intestine without perforation or abscess without bleeding: Secondary | ICD-10-CM | POA: Diagnosis not present

## 2019-01-23 LAB — BASIC METABOLIC PANEL
Anion gap: 9 (ref 5–15)
BUN: 10 mg/dL (ref 8–23)
CO2: 26 mmol/L (ref 22–32)
Calcium: 8.9 mg/dL (ref 8.9–10.3)
Chloride: 108 mmol/L (ref 98–111)
Creatinine, Ser: 0.71 mg/dL (ref 0.44–1.00)
GFR calc Af Amer: >60 mL/min (ref >60)
GFR calc non Af Amer: >60 mL/min (ref >60)
Glucose, Bld: 112 mg/dL — ABNORMAL HIGH (ref 70–99)
Potassium: 3.7 mmol/L (ref 3.5–5.1)
Sodium: 143 mmol/L (ref 135–145)

## 2019-01-23 LAB — CBC
HCT: 37.1 % (ref 36.0–46.0)
Hemoglobin: 12.3 g/dL (ref 12.0–15.0)
MCH: 30.7 pg (ref 26.0–34.0)
MCHC: 33.2 g/dL (ref 30.0–36.0)
MCV: 92.5 fL (ref 80.0–100.0)
Platelets: 204 10*3/uL (ref 150–400)
RBC: 4.01 MIL/uL (ref 3.87–5.11)
RDW: 12.8 % (ref 11.5–15.5)
WBC: 9 10*3/uL (ref 4.0–10.5)
nRBC: 0 % (ref 0.0–0.2)

## 2019-01-23 LAB — HEMOGLOBIN A1C
Hgb A1c MFr Bld: 6.5 % — ABNORMAL HIGH (ref 4.8–5.6)
Mean Plasma Glucose: 139.85 mg/dL

## 2019-01-23 LAB — GLUCOSE, CAPILLARY
Glucose-Capillary: 122 mg/dL — ABNORMAL HIGH (ref 70–99)
Glucose-Capillary: 118 mg/dL — ABNORMAL HIGH (ref 70–99)
Glucose-Capillary: 111 mg/dL — ABNORMAL HIGH (ref 70–99)

## 2019-01-23 MED ORDER — LISINOPRIL 10 MG PO TABS
5.0000 mg | ORAL_TABLET | Freq: Every day | ORAL | Status: DC
  Administered 2019-01-23: 5 mg via ORAL
  Filled 2019-01-23: qty 1

## 2019-01-23 MED ORDER — SODIUM CHLORIDE 0.9 % IV SOLN
INTRAVENOUS | Status: DC | PRN
  Administered 2019-01-23: 250 mL via INTRAVENOUS

## 2019-01-23 MED ORDER — INSULIN ASPART 100 UNIT/ML ~~LOC~~ SOLN: 0.0000 [IU] | Freq: Three times a day (TID) | SUBCUTANEOUS | Status: DC

## 2019-01-23 MED ORDER — INSULIN ASPART 100 UNIT/ML ~~LOC~~ SOLN: 0.0000 [IU] | Freq: Every day | SUBCUTANEOUS | Status: DC

## 2019-01-23 NOTE — Consult Note (Signed)
Neurosurgery-New Consultation Evaluation 01/23/2019 Pamela Pamela Rios Pamela Rios [id number]  Identifying Statement: Pamela Pamela Rios Pamela Pamela Rios is a 73 [place] [id number] with right arm numbness  Physician Requesting Consultation: Enedina FinnerSona Patel, MD  History of Present Illness: Ms. Pamela Pamela Rios is admitted with concern for diverticulitis which is being treated.  She was complaining of right arm numbness that happened approximate 2 months ago without any obvious etiology.  She denies any pain down her arm but does have some pain in the right side of her neck.  She denies any obvious weakness.  She is right-handed.  She denies any left arm symptoms.  She is not endorse any history of similar symptoms like this in the past.  She had not undergone any treatment prior to this hospitalization for this.  MRI of the brain and cervical spine were obtained for evaluation.  Past Medical History:  Past Medical History:  Diagnosis Date  . Diabetes mellitus without complication (HCC)   . Diverticulitis   . Hyperlipidemia   . Hypertension     Social History: Social History   Socioeconomic History  . Marital status: Married    Spouse name: Not on file  . Number of children: Not on file  . Years of education: Not on file  . Highest education level: Not on file  Occupational History  . Not on file  Social Needs  . Financial resource strain: Not on file  . Food insecurity    Worry: Not on file    Inability: Not on file  . Transportation needs    Medical: Not on file    Non-medical: Not on file  Tobacco Use  . Smoking status: Never Smoker  . Smokeless tobacco: Never Used  Substance and Sexual Activity  . Alcohol use: Never    Frequency: Never  . Drug use: Never  . Sexual activity: Not on file  Lifestyle  . Physical activity    Days per week: Not on file    Minutes per session: Not on file  . Stress: Not on file  Relationships  . Social Musicianconnections    Talks on phone: Not on file    Gets together: Not on file     Attends religious service: Not on file    Active member of club or organization: Not on file    Attends meetings of clubs or organizations: Not on file    Relationship status: Not on file  . Intimate partner violence    Fear of current or ex partner: Not on file    Emotionally abused: Not on file    Physically abused: Not on file    Forced sexual activity: Not on file  Other Topics Concern  . Not on file  Social History Narrative  . Not on file   Living arrangements (living alone, with partner): [place]  Family History: Family History  Problem Relation Age of Onset  . Breast cancer Mother   . CVA Mother   . Diabetes Mother   . Hypertension Mother   . CAD Father     Review of Systems:  Review of Systems - General ROS: Negative Psychological ROS: Negative Ophthalmic ROS: Negative ENT ROS: Negative Hematological and Lymphatic ROS: Negative  Endocrine ROS: Negative Respiratory ROS: Negative Cardiovascular ROS: Negative Gastrointestinal ROS: Negative Genito-Urinary ROS: Negative Musculoskeletal ROS: Positive for neck pain Neurological ROS: Positive for right arm numbness, negative for weakness Dermatological ROS: Negative  Physical Exam: BP 132/71 (BP Location: Left Arm)   Pulse 86  Temp 97.9 F (36.6 C) (Oral)   Resp 17   Ht 5\' 2"  (1.575 m)   Wt 75 kg   SpO2 94%   BMI 30.24 kg/m  Body mass index is 30.24 kg/m. Body surface area is 1.81 meters squared. General appearance: Alert, cooperative, in no acute distress Head: Normocephalic, atraumatic Eyes: Normal, EOM intact Oropharynx: Moist without lesions Neck: Supple, range of motion appears full Ext: Warm extremities  Neurologic exam:  Mental status: alertness: alert,  affect: normal Speech: fluent and clear Motor:strength symmetric 5/5 in bilateral upper and lower extremities Sensory: Decreased to light touch in the right arm from the deltoid area to the tips of her fingers, and all  dermatomes.  Normal sensation throughout her other extremities Reflexes: 2+ and symmetric bilaterally for patella Gait: Not tested  Laboratory: Results for orders placed or performed during the hospital encounter of 01/22/19  SARS Coronavirus 2 (CEPHEID- Performed in [place] hospital lab), [facility] Order   Specimen: Nasopharyngeal Swab  Result Value Ref Range   SARS Coronavirus 2 NEGATIVE NEGATIVE  Lipase, blood  Result Value Ref Range   Lipase 30 11 - 51 U/L  Comprehensive metabolic panel  Result Value Ref Range   Sodium 139 135 - 145 mmol/L   Potassium 4.2 3.5 - 5.1 mmol/L   Chloride 106 98 - 111 mmol/L   CO2 24 22 - 32 mmol/L   Glucose, Bld 164 (H) 70 - 99 mg/dL   BUN 11 8 - 23 mg/dL   Creatinine, Ser 0.74 0.44 - 1.00 mg/dL   Calcium 9.3 8.9 - 10.3 mg/dL   Total Protein 7.5 6.5 - 8.1 g/dL   Albumin 4.5 3.5 - 5.0 g/dL   AST 17 15 - 41 U/L   ALT 17 0 - 44 U/L   Alkaline Phosphatase 83 38 - 126 U/L   Total Bilirubin 0.7 0.3 - 1.2 mg/dL   GFR calc non Af Amer >60 >60 mL/min   GFR calc Af Amer >60 >60 mL/min   Anion gap 9 5 - 15  CBC  Result Value Ref Range   WBC 12.1 (H) 4.0 - 10.5 K/uL   RBC 4.20 3.87 - 5.11 MIL/uL   Hemoglobin 12.8 12.0 - 15.0 g/dL   HCT 38.3 36.0 - 46.0 %   MCV 91.2 80.0 - 100.0 fL   MCH 30.5 26.0 - 34.0 pg   MCHC 33.4 30.0 - 36.0 g/dL   RDW 12.6 11.5 - 15.5 %   Platelets 214 150 - 400 K/uL   nRBC 0.0 0.0 - 0.2 %  Urinalysis, Complete w Microscopic  Result Value Ref Range   Color, Urine STRAW (A) YELLOW   APPearance CLEAR (A) CLEAR   Specific Gravity, Urine 1.003 (L) 1.005 - 1.030   pH 6.0 5.0 - 8.0   Glucose, UA NEGATIVE NEGATIVE mg/dL   Hgb urine dipstick NEGATIVE NEGATIVE   Bilirubin Urine NEGATIVE NEGATIVE   Ketones, ur NEGATIVE NEGATIVE mg/dL   Protein, ur NEGATIVE NEGATIVE mg/dL   Nitrite NEGATIVE NEGATIVE   Leukocytes,Ua TRACE (A) NEGATIVE   WBC, UA 0-5 0 - 5 WBC/hpf   Bacteria, UA NONE SEEN NONE SEEN   Squamous Epithelial / LPF  NONE SEEN 0 - 5   Mucus PRESENT   Basic metabolic panel  Result Value Ref Range   Sodium 143 135 - 145 mmol/L   Potassium 3.7 3.5 - 5.1 mmol/L   Chloride 108 98 - 111 mmol/L   CO2 26 22 - 32 mmol/L  Glucose, Bld 112 (H) 70 - 99 mg/dL   BUN 10 8 - 23 mg/dL   Creatinine, Ser 9.600.71 0.44 - 1.00 mg/dL   Calcium 8.9 8.9 - 45.410.3 mg/dL   GFR calc non Af Amer >60 >60 mL/min   GFR calc Af Amer >60 >60 mL/min   Anion gap 9 5 - 15  CBC  Result Value Ref Range   WBC 9.0 4.0 - 10.5 K/uL   RBC 4.01 3.87 - 5.11 MIL/uL   Hemoglobin 12.3 12.0 - 15.0 g/dL   HCT 09.837.1 11.936.0 - 14.746.0 %   MCV 92.5 80.0 - 100.0 fL   MCH 30.7 26.0 - 34.0 pg   MCHC 33.2 30.0 - 36.0 g/dL   RDW 82.912.8 56.211.5 - 13.015.5 %   Platelets 204 150 - 400 K/uL   nRBC 0.0 0.0 - 0.2 %  Glucose, capillary  Result Value Ref Range   Glucose-Capillary 118 (H) 70 - 99 mg/dL   Comment 1 Document in Chart    Comment 2 Call MD NNP PA CNM   Glucose, capillary  Result Value Ref Range   Glucose-Capillary 208 (H) 70 - 99 mg/dL  Glucose, capillary  Result Value Ref Range   Glucose-Capillary 122 (H) 70 - 99 mg/dL   I personally reviewed labs  Imaging: MRI brain:There is no acute infarct, acute hemorrhage or extra-axial collection. The midline structures are normal. There is no midline shift or mass effect. Mild white matter hyperintensity, most commonly due to chronic ischemic microangiopathy, though not unexpected for age. The cerebral and cerebellar volume are age-appropriate. There is no hydrocephalus. Susceptibility-sensitive sequences show no chronic microhemorrhage or superficial siderosis.  MRI cervical spine: There is straightening of the lordotic curvature.  There is a normal alignment.  There is a disc osteophyte complex at C3-4 eccentric to the right that results in moderate stenosis.  There additionally disc bulges at C4-5 and C5-6 with mild to moderate stenosis on the right.   Impression/Plan:  Ms. Pamela Pamela Rios is here for treatment of  diverticulitis but also found to have 3 months of right arm numbness.  This does not fit any specific dermatome and given the entire right arm, of the brain and cervical spine was performed.  There is some foraminal and recess stenosis noted in the cervical spine on the right, however the patient is not having any radicular pain in the numbness fits in multiple distributions including C7 and C8 which are not affected by these.  There is no obvious lesion on the MRI of the brain to explain this either.  Given this, I do think the patient will need a nerve conduction study of her upper extremity as I currently do not see any obvious compressive pathology amenable to surgery.  She does not have any obvious weakness on exam which is reassuring.   1.  Diagnosis: Right arm numbness  2.  Plan -Recommend referral to neurology for evaluation and possible nerve study. -Can follow-up as outpatient as needed pending results of that study

## 2019-01-23 NOTE — Progress Notes (Signed)
Daughter called to set up password with patient. Also update given to daughter. Daughter stated her Mother had fallen down the stairs last December, but did not see medical attention.   Fuller Mandril, RN

## 2019-01-23 NOTE — Progress Notes (Signed)
[facility] [facility] [place] NAME: Pamela Rios    MR#:  [id number]  DATE OF BIRTH:  June 01, 1946  SUBJECTIVE:   Patient admitted after having abdominal pain despite taking oral antibiotic for recent diagnosis of diverticulitis. She rates her abdominal pain is 5/10. Feels better able to tolerate PO diet no nausea vomiting of blood he diarrhea.  Complains of right arm numbness for last three months. REVIEW OF SYSTEMS:   Review of Systems  Constitutional: Negative for chills, fever and weight loss.  HENT: Negative for ear discharge, ear pain and nosebleeds.   Eyes: Negative for blurred vision, pain and discharge.  Respiratory: Negative for sputum production, shortness of breath, wheezing and stridor.   Cardiovascular: Negative for chest pain, palpitations, orthopnea and PND.  Gastrointestinal: Positive for abdominal pain. Negative for diarrhea, nausea and vomiting.  Genitourinary: Negative for frequency and urgency.  Musculoskeletal: Negative for back pain and joint pain.  Neurological: Positive for sensory change and weakness. Negative for speech change and focal weakness.  Psychiatric/Behavioral: Negative for depression and hallucinations. The patient is not nervous/anxious.    Tolerating Diet:yes Tolerating PT:   DRUG ALLERGIES:   Allergies  Allergen Reactions  . Sulfa Antibiotics Rash    VITALS:  Blood pressure 135/70, pulse 89, temperature 98 F (36.7 C), temperature source Oral, resp. rate 18, height 5\' 2"  (1.575 m), weight 75 kg, SpO2 93 %.  PHYSICAL EXAMINATION:   Physical Exam  GENERAL:  73 y.o.-year-old patient lying in the bed with no acute distress.  EYES: Pupils equal, round, reactive to light and accommodation. No scleral icterus. Extraocular muscles intact.  HEENT: Head atraumatic, normocephalic. Oropharynx and nasopharynx clear.  NECK:  Supple, no jugular venous distention. No thyroid enlargement, no tenderness.   LUNGS: Normal breath sounds bilaterally, no wheezing, rales, rhonchi. No use of accessory muscles of respiration.  CARDIOVASCULAR: S1, S2 normal. No murmurs, rubs, or gallops.  ABDOMEN: Soft, nontender, nondistended. Bowel sounds present. No organomegaly or mass.  EXTREMITIES: No cyanosis, clubbing or edema b/l.    NEUROLOGIC: Cranial nerves II through XII are intact. No focal Motor or sensory deficits b/l.   PSYCHIATRIC:  patient is alert and oriented x 3.  SKIN: No obvious rash, lesion, or ulcer.   LABORATORY PANEL:  CBC Recent Labs  Lab 01/23/19 0335  WBC 9.0  HGB 12.3  HCT 37.1  PLT 204    Chemistries  Recent Labs  Lab 01/22/19 1458 01/23/19 0335  NA 139 143  K 4.2 3.7  CL 106 108  CO2 24 26  GLUCOSE 164* 112*  BUN 11 10  CREATININE 0.74 0.71  CALCIUM 9.3 8.9  AST 17  --   ALT 17  --   ALKPHOS 83  --   BILITOT 0.7  --    Cardiac Enzymes No results for input(s): TROPONINI in the last 168 hours. RADIOLOGY:  Mr Brain Wo Contrast  Result Date: 01/22/2019 CLINICAL DATA:  Right arm numbness. EXAM: MRI HEAD WITHOUT CONTRAST MRI CERVICAL SPINE WITHOUT CONTRAST TECHNIQUE: Multiplanar, multiecho pulse sequences of the brain and surrounding structures, and cervical spine, to include the craniocervical junction and cervicothoracic junction, were obtained without intravenous contrast. COMPARISON:  None. FINDINGS: MRI HEAD FINDINGS Brain: There is no acute infarct, acute hemorrhage or extra-axial collection. The midline structures are normal. There is no midline shift or mass effect. Mild white matter hyperintensity, most commonly due to chronic ischemic microangiopathy, though not unexpected for age. The cerebral and  cerebellar volume are age-appropriate. There is no hydrocephalus. Susceptibility-sensitive sequences show no chronic microhemorrhage or superficial siderosis. Vascular: The major intracranial arterial and venous sinus flow voids are preserved. Skull and upper cervical  spine: The visualized skull base, calvarium, upper cervical spine and extracranial soft tissues are normal. Sinuses/Orbits: No fluid levels or advanced mucosal thickening of the visualized paranasal sinuses. No mastoid or middle ear effusion. The orbits are normal. Other: None MRI CERVICAL SPINE FINDINGS Alignment: Normal Vertebrae: Normal marrow signal Cord: Normal signal and morphology Posterior Fossa, vertebral arteries, paraspinal tissues: Visualized posterior fossa is normal. Vertebral artery flow voids are preserved. No prevertebral soft tissue swelling. Disc levels: C2-3: No disc herniation or stenosis. C3-4: Large right subarticular disc protrusion causing severe right neural foraminal stenosis and indenting the right ventral aspect of the spinal cord. No central spinal canal stenosis. No left foraminal stenosis. C4-5: Bilateral uncovertebral hypertrophy causing moderate bilateral neural foraminal stenosis, right greater than left. No spinal canal stenosis. C5-6: Intermediate disc osteophyte complex. No spinal canal stenosis. Moderate bilateral neural foraminal stenosis. C7-T1: Disc bulge without spinal canal or neural foraminal stenosis. C7-T1: Normal. Sagittal imaging at the T1-T4 levels is normal. IMPRESSION: 1. Mild chronic small vessel changes of the white matter without acute abnormality. 2. C3-4 large right subarticular disc protrusion severely narrowing the right neural foramen. 3. Moderate bilateral C4-5 and C5-6 neural foraminal stenosis due to the presence of disc osteophyte complexes. Electronically Signed   By: Deatra RobinsonKevin  Herman M.D.   On: 01/22/2019 23:03   Mr Cervical Spine Wo Contrast  Result Date: 01/22/2019 CLINICAL DATA:  Right arm numbness. EXAM: MRI HEAD WITHOUT CONTRAST MRI CERVICAL SPINE WITHOUT CONTRAST TECHNIQUE: Multiplanar, multiecho pulse sequences of the brain and surrounding structures, and cervical spine, to include the craniocervical junction and cervicothoracic junction, were  obtained without intravenous contrast. COMPARISON:  None. FINDINGS: MRI HEAD FINDINGS Brain: There is no acute infarct, acute hemorrhage or extra-axial collection. The midline structures are normal. There is no midline shift or mass effect. Mild white matter hyperintensity, most commonly due to chronic ischemic microangiopathy, though not unexpected for age. The cerebral and cerebellar volume are age-appropriate. There is no hydrocephalus. Susceptibility-sensitive sequences show no chronic microhemorrhage or superficial siderosis. Vascular: The major intracranial arterial and venous sinus flow voids are preserved. Skull and upper cervical spine: The visualized skull base, calvarium, upper cervical spine and extracranial soft tissues are normal. Sinuses/Orbits: No fluid levels or advanced mucosal thickening of the visualized paranasal sinuses. No mastoid or middle ear effusion. The orbits are normal. Other: None MRI CERVICAL SPINE FINDINGS Alignment: Normal Vertebrae: Normal marrow signal Cord: Normal signal and morphology Posterior Fossa, vertebral arteries, paraspinal tissues: Visualized posterior fossa is normal. Vertebral artery flow voids are preserved. No prevertebral soft tissue swelling. Disc levels: C2-3: No disc herniation or stenosis. C3-4: Large right subarticular disc protrusion causing severe right neural foraminal stenosis and indenting the right ventral aspect of the spinal cord. No central spinal canal stenosis. No left foraminal stenosis. C4-5: Bilateral uncovertebral hypertrophy causing moderate bilateral neural foraminal stenosis, right greater than left. No spinal canal stenosis. C5-6: Intermediate disc osteophyte complex. No spinal canal stenosis. Moderate bilateral neural foraminal stenosis. C7-T1: Disc bulge without spinal canal or neural foraminal stenosis. C7-T1: Normal. Sagittal imaging at the T1-T4 levels is normal. IMPRESSION: 1. Mild chronic small vessel changes of the white matter  without acute abnormality. 2. C3-4 large right subarticular disc protrusion severely narrowing the right neural foramen. 3. Moderate bilateral C4-5 and C5-6  neural foraminal stenosis due to the presence of disc osteophyte complexes. Electronically Signed   By: Deatra RobinsonKevin  Herman M.D.   On: 01/22/2019 23:03   Ct Abdomen Pelvis W Contrast  Result Date: 01/22/2019 CLINICAL DATA:  73 year old with chronic LEFT LOWER QUADRANT abdominal pain and diagnosis of low-grade acute diverticulitis 3 days ago, presenting now with worsening pain despite antibiotic therapy with ciprofloxacin and Flagyl. EXAM: CT ABDOMEN AND PELVIS WITH CONTRAST TECHNIQUE: Multidetector CT imaging of the abdomen and pelvis was performed using the standard protocol following bolus administration of intravenous contrast. CONTRAST:  100mL OMNIPAQUE IOHEXOL 300 MG/ML IV. Oral contrast was also administered. COMPARISON:  01/19/2019. FINDINGS: Lower chest: Mild dependent atelectasis in the lower lobes. Visualized lung bases otherwise clear. Heart moderately enlarged, unchanged. Hepatobiliary: Mild diffuse hepatic steatosis as noted 3 days ago. A focal eventration of the RIGHT POSTERIOR hemidiaphragm is favored over a pleural nodule at the base of the RIGHT hemithorax. Benign cyst involving the MEDIAL segment LEFT lobe of liver. No significant focal hepatic parenchymal abnormality. Surgically absent gallbladder. No unexpected biliary ductal dilation. Pancreas: Normal in appearance without evidence of mass, ductal dilation, or inflammation. Spleen: Normal in size and appearance. Adrenals/Urinary Tract: Normal appearing adrenal glands. Kidneys normal in size and appearance without focal parenchymal abnormality. No hydronephrosis. No evidence of urinary tract calculi. Normal appearing urinary bladder. Stomach/Bowel: Stomach normal in appearance for the degree of distention. Normal-appearing small bowel. Interval progression of acute inflammatory changes  involving the proximal sigmoid colon since the CT 3 days ago. No extraluminal gas or abnormal fluid collection. No new areas of diverticulitis. Surgical clip at the cecal tip presumably related to prior appendectomy. Vascular/Lymphatic: Mild aortoiliac atherosclerosis without evidence of aneurysm. Normal-appearing portal venous and systemic venous systems. No pathologic lymphadenopathy. Reproductive: Normal-appearing uterus and ovaries without evidence of adnexal mass. Other: Numerous pelvic phleboliths. 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 BILATERAL L5 pars defects and grade 2 spondylolisthesis of L5 on S1 measuring approximately 9 mm. Facet degenerative changes at L4-5 and L5-S1. Degenerative disc disease and spondylosis involving the LOWER thoracic spine. No acute findings. IMPRESSION: 1. Mild interval progression of acute diverticulitis involving the proximal sigmoid colon since the CT 3 days ago. No evidence of perforation or abscess. 2. No acute abnormalities otherwise involving the abdomen or pelvis. 3. Mild diffuse hepatic steatosis. 4. BILATERAL L5 pars defects and grade 2 spondylolisthesis of L5 on S1 measuring approximately 9 mm. Aortic Atherosclerosis (ICD10-I70.0). Electronically Signed   By: Hulan Saashomas  Lawrence M.D.   On: 01/22/2019 19:38   ASSESSMENT AND PLAN:   Pamela Rios  is a 73 y.o. female with recently diagnosed diverticulitis.  Patient is having 2 weeks of abdominal pain in the left lower quadrant.  She went to the urgent care on Friday was diagnosed with diverticulitis and given Cipro and Flagyl.  The pain kept on getting worse and worse and no relief  1.  Acute diverticulitis failed outpatient treatment treatment with Cipro and Flagyl. - We will give IV Zosyn.  Pain control.  Nausea control. -Patient feels better.  2.  Numbness of the right arm.  - MRI of the cervical spine and brain reviewed. Case discussed with Dr. Adriana Simasook neurosurgery.  Recommends neurology workup as outpatient for nerve conduction study. No urgent neurosurgical intervention needed.  3.  Type 2 diabetes mellitus on Glucophage and sliding scale  4.  Hypertension on low-dose lisinopril  5.  Hyperlipidemia unspecified on Zocor  If continues to show improvement will  discharge her tomorrow. Patient is agreeable.   Case discussed with Care Management/Social Worker. Management plans discussed with the patient  CODE STATUS: full  DVT Prophylaxis: lvoenx  TOTAL TIME TAKING CARE OF THIS PATIENT: *30* minutes.  >50% time spent on counselling and coordination of care  POSSIBLE D/C IN *1-2* DAYS, DEPENDING ON CLINICAL CONDITION.  Note: This dictation was prepared with Dragon dictation along with smaller phrase technology. Any transcriptional errors that result from this process are unintentional.  Enedina FinnerSona Airel Magadan M.D on 01/23/2019 at 2:59 PM  Between 7am to 6pm - Pager - [phone]  After 6pm go to [email or web address] - password [email or web address]  [facility]  Office  [phone]  CC: Primary care physician; Patient, No Pcp PerPatient ID: Pamela Rios, female   DOB: 04/06/1946, 73 y.o.   MRN: [id number]

## 2019-01-24 DIAGNOSIS — K5732 Diverticulitis of large intestine without perforation or abscess without bleeding: Secondary | ICD-10-CM | POA: Diagnosis not present

## 2019-01-24 DIAGNOSIS — R109 Unspecified abdominal pain: Secondary | ICD-10-CM | POA: Diagnosis not present

## 2019-01-24 LAB — GLUCOSE, CAPILLARY: Glucose-Capillary: 112 mg/dL — ABNORMAL HIGH (ref 70–99)

## 2019-01-24 LAB — HIV ANTIBODY (ROUTINE TESTING W REFLEX): HIV Screen 4th Generation wRfx: NONREACTIVE

## 2019-01-24 MED ORDER — AMOXICILLIN-POT CLAVULANATE 875-125 MG PO TABS: 1.0000 | ORAL_TABLET | Freq: Two times a day (BID) | ORAL | 0 refills | Status: DC

## 2019-01-24 MED ORDER — AMOXICILLIN-POT CLAVULANATE 875-125 MG PO TABS
1.0000 | ORAL_TABLET | Freq: Two times a day (BID) | ORAL | Status: DC
  Administered 2019-01-24: 1 via ORAL
  Filled 2019-01-24: qty 1

## 2019-01-24 NOTE — Discharge Instructions (Signed)
F/u with Neurology as out pt for Nerve conduction study for right UE numbness

## 2019-01-24 NOTE — Progress Notes (Signed)
Nsg Discharge Note  Admit Date:  01/22/2019 Discharge date: 01/24/2019   Pamela Rios to be D/C'd Home per MD order.  AVS completed.  Copy for chart, and copy for patient signed, and dated. Patient/caregiver able to verbalize understanding.  Discharge Medication: Allergies as of 01/24/2019      Reactions   Sulfa Antibiotics Rash      Medication List    STOP taking these medications   ciprofloxacin 500 MG tablet Commonly known as: CIPRO   metroNIDAZOLE 500 MG tablet Commonly known as: FLAGYL     TAKE these medications   albuterol 108 (90 Base) MCG/ACT inhaler Commonly known as: VENTOLIN HFA Inhale 2 puffs into the lungs every 4 (four) hours as needed for wheezing.   amoxicillin-clavulanate 875-125 MG tablet Commonly known as: AUGMENTIN Take 1 tablet by mouth every 12 (twelve) hours.   lisinopril 5 MG tablet Commonly known as: ZESTRIL Take 5 mg by mouth daily.   metFORMIN 500 MG tablet Commonly known as: GLUCOPHAGE Take 500 mg by mouth daily with breakfast.   simvastatin 20 MG tablet Commonly known as: ZOCOR Take 20 mg by mouth daily at 6 PM.       Discharge Assessment: Vitals:   01/23/19 2117 01/24/19 0511  BP: (!) 146/68 138/62  Pulse: 63 62  Resp: 20 18  Temp: 98.2 F (36.8 C) 97.8 F (36.6 C)  SpO2: 98% 97%   Skin clean, dry and intact without evidence of skin break down, no evidence of skin tears noted. IV catheter discontinued intact. Site without signs and symptoms of complications - no redness or edema noted at insertion site, patient denies c/o pain - only slight tenderness at site.  Dressing with slight pressure applied.  D/c Instructions-Education: Discharge instructions given to patient/family with verbalized understanding. D/c education completed with patient/family including follow up instructions, medication list, d/c activities limitations if indicated, with other d/c instructions as indicated by MD - patient able to verbalize understanding,  all questions fully answered. Patient instructed to return to ED, call 911, or call MD for any changes in condition.  Patient escorted via [place], and D/C home via private auto.  Eda Keys, RN 01/24/2019 10:16 AM

## 2019-01-24 NOTE — Discharge Summary (Signed)
[facility] [place] at [facility]   PATIENT NAME: Pamela Rios    MR#:  [id number]  DATE OF BIRTH:  06/05/1946  DATE OF ADMISSION:  01/22/2019 ADMITTING PHYSICIAN: Alford Highlandichard Wieting, MD  DATE OF DISCHARGE: 01/24/2019  PRIMARY CARE PHYSICIAN: Patient, No Pcp Per    ADMISSION DIAGNOSIS:  Diverticulitis [K57.92]  DISCHARGE DIAGNOSIS:  Acute Diverticulitis Right UE numbness--w/u as out pt with Neurology for Nerve conduction study  SECONDARY DIAGNOSIS:   Past Medical History:  Diagnosis Date  . Diabetes mellitus without complication (HCC)   . Diverticulitis   . Hyperlipidemia   . Hypertension     HOSPITAL COURSE:   GloriaAraujois a73 y.o.femalewith recently diagnosed diverticulitis. Patient is having 2 weeks of abdominal pain in the left lower quadrant. She went to the urgent care on Friday was diagnosed with diverticulitis and given Cipro and Flagyl. The pain kept on getting worse and worse and no relief  1.Acute diverticulitis failed outpatient treatment treatment with Cipro and Flagyl. -We will give IV Zosyn--. Po augmentin -Pain  Under control.  -Patient feels better-- awaiting regular diet. No fever.  2. Numbness of the right arm.  -MRI of the cervical spine and brain reviewed. -Case discussed with Dr. Adriana Simasook neurosurgery. Recommends neurology workup as outpatient for nerve conduction study. No urgent neurosurgical intervention needed. -Will try to set up patient for outpatient neurology appointment  3. Type 2 diabetes mellitus on Glucophage and sliding scale  4. Hypertension on low-dose lisinopril  5. Hyperlipidemia unspecified on Zocor  pt continues to show improvement will discharge her today. Patient is agreeable.   CONSULTS OBTAINED:  Treatment Team:  Lucy Chrisook, Steven, MD  DRUG ALLERGIES:   Allergies  Allergen Reactions  . Sulfa Antibiotics Rash    DISCHARGE MEDICATIONS:   Allergies as of 01/24/2019       Reactions   Sulfa Antibiotics Rash      Medication List    STOP taking these medications   ciprofloxacin 500 MG tablet Commonly known as: CIPRO   metroNIDAZOLE 500 MG tablet Commonly known as: FLAGYL     TAKE these medications   albuterol 108 (90 Base) MCG/ACT inhaler Commonly known as: VENTOLIN HFA Inhale 2 puffs into the lungs every 4 (four) hours as needed for wheezing.   amoxicillin-clavulanate 875-125 MG tablet Commonly known as: AUGMENTIN Take 1 tablet by mouth every 12 (twelve) hours.   lisinopril 5 MG tablet Commonly known as: ZESTRIL Take 5 mg by mouth daily.   metFORMIN 500 MG tablet Commonly known as: GLUCOPHAGE Take 500 mg by mouth daily with breakfast.   simvastatin 20 MG tablet Commonly known as: ZOCOR Take 20 mg by mouth daily at 6 PM.       If you experience worsening of your admission symptoms, develop shortness of breath, life threatening emergency, suicidal or homicidal thoughts you must seek medical attention immediately by calling 911 or calling your MD immediately  if symptoms less severe.  You Must read complete instructions/literature along with all the possible adverse reactions/side effects for all the Medicines you take and that have been prescribed to you. Take any new Medicines after you have completely understood and accept all the possible adverse reactions/side effects.   Please note  You were cared for by a hospitalist during your hospital stay. If you have any questions about your discharge medications or the care you received while you were in the hospital after you are discharged, you can call the unit and asked to speak  with the hospitalist on call if the hospitalist that took care of you is not available. Once you are discharged, your primary care physician will handle any further medical issues. Please note that NO REFILLS for any discharge medications will be authorized once you are discharged, as it is imperative that you return to  your primary care physician (or establish a relationship with a primary care physician if you do not have one) for your aftercare needs so that they can reassess your need for medications and monitor your lab values. Today   SUBJECTIVE   Mild HA. Tolerating diet  VITAL SIGNS:  Blood pressure 138/62, pulse 62, temperature 97.8 F (36.6 C), temperature source Oral, resp. rate 18, height 5\' 2"  (1.575 m), weight 75 kg, SpO2 97 %.  I/O:    Intake/Output Summary (Last 24 hours) at 01/24/2019 0809 Last data filed at 01/23/2019 1821 Gross per 24 hour  Intake 635.18 ml  Output 500 ml  Net 135.18 ml    PHYSICAL EXAMINATION:  GENERAL:  73 y.o.-year-old patient lying in the bed with no acute distress.  EYES: Pupils equal, round, reactive to light and accommodation. No scleral icterus. Extraocular muscles intact.  HEENT: Head atraumatic, normocephalic. Oropharynx and nasopharynx clear.  NECK:  Supple, no jugular venous distention. No thyroid enlargement, no tenderness.  LUNGS: Normal breath sounds bilaterally, no wheezing, rales,rhonchi or crepitation. No use of accessory muscles of respiration.  CARDIOVASCULAR: S1, S2 normal. No murmurs, rubs, or gallops.  ABDOMEN: Soft, non-tender, non-distended. Bowel sounds present. No organomegaly or mass.  EXTREMITIES: No pedal edema, cyanosis, or clubbing.  NEUROLOGIC: Cranial nerves II through XII are intact. Muscle strength 5/5 in all extremities. Sensation intact. Gait not checked.  PSYCHIATRIC: The patient is alert and oriented x 3.  SKIN: No obvious rash, lesion, or ulcer.   DATA REVIEW:   CBC  Recent Labs  Lab 01/23/19 0335  WBC 9.0  HGB 12.3  HCT 37.1  PLT 204    Chemistries  Recent Labs  Lab 01/22/19 1458 01/23/19 0335  NA 139 143  K 4.2 3.7  CL 106 108  CO2 24 26  GLUCOSE 164* 112*  BUN 11 10  CREATININE 0.74 0.71  CALCIUM 9.3 8.9  AST 17  --   ALT 17  --   ALKPHOS 83  --   BILITOT 0.7  --     Microbiology Results    Recent Results (from the past 240 hour(s))  SARS Coronavirus 2 (CEPHEID- Performed in [place] hospital lab), Hosp Order     Status: None   Collection Time: 01/22/19  7:56 PM   Specimen: Nasopharyngeal Swab  Result Value Ref Range Status   SARS Coronavirus 2 NEGATIVE NEGATIVE Final    Comment: (NOTE) If result is NEGATIVE SARS-CoV-2 target nucleic acids are NOT DETECTED. The SARS-CoV-2 RNA is generally detectable in upper and lower  respiratory specimens during the acute phase of infection. The lowest  concentration of SARS-CoV-2 viral copies this assay can detect is 250  copies / mL. A negative result does not preclude SARS-CoV-2 infection  and should not be used as the sole basis for treatment or other  patient management decisions.  A negative result may occur with  improper specimen collection / handling, submission of specimen other  than nasopharyngeal swab, presence of viral mutation(s) within the  areas targeted by this assay, and inadequate number of viral copies  (<250 copies / mL). A negative result must be combined with clinical  observations,  patient history, and epidemiological information. If result is POSITIVE SARS-CoV-2 target nucleic acids are DETECTED. The SARS-CoV-2 RNA is generally detectable in upper and lower  respiratory specimens dur ing the acute phase of infection.  Positive  results are indicative of active infection with SARS-CoV-2.  Clinical  correlation with patient history and other diagnostic information is  necessary to determine patient infection status.  Positive results do  not rule out bacterial infection or co-infection with other viruses. If result is PRESUMPTIVE POSTIVE SARS-CoV-2 nucleic acids MAY BE PRESENT.   A presumptive positive result was obtained on the submitted specimen  and confirmed on repeat testing.  While 2019 novel coronavirus  (SARS-CoV-2) nucleic acids may be present in the submitted sample  additional confirmatory  testing may be necessary for epidemiological  and / or clinical management purposes  to differentiate between  SARS-CoV-2 and other Sarbecovirus currently known to infect humans.  If clinically indicated additional testing with an alternate test  methodology [phone]) is advised. The SARS-CoV-2 RNA is generally  detectable in upper and lower respiratory sp ecimens during the acute  phase of infection. The expected result is Negative. Fact Sheet for Patients:  BoilerBrush.com.cy Fact Sheet for Healthcare Providers: https://pope.com/ This test is not yet approved or cleared by the [place] FDA and has been authorized for detection and/or diagnosis of SARS-CoV-2 by FDA under an Emergency Use Authorization (EUA).  This EUA will remain in effect (meaning this test can be used) for the duration of the COVID-19 declaration under Section 564(b)(1) of the Act, 21 U.S.C. section 360bbb-3(b)(1), unless the authorization is terminated or revoked sooner. Performed at [facility], [address], [place], [place] [address]     RADIOLOGY:  Mr Sherrin Daisy Contrast  Result Date: 01/22/2019 CLINICAL DATA:  Right arm numbness. EXAM: MRI HEAD WITHOUT CONTRAST MRI CERVICAL SPINE WITHOUT CONTRAST TECHNIQUE: Multiplanar, multiecho pulse sequences of the brain and surrounding structures, and cervical spine, to include the craniocervical junction and cervicothoracic junction, were obtained without intravenous contrast. COMPARISON:  None. FINDINGS: MRI HEAD FINDINGS Brain: There is no acute infarct, acute hemorrhage or extra-axial collection. The midline structures are normal. There is no midline shift or mass effect. Mild white matter hyperintensity, most commonly due to chronic ischemic microangiopathy, though not unexpected for age. The cerebral and cerebellar volume are age-appropriate. There is no hydrocephalus. Susceptibility-sensitive sequences show  no chronic microhemorrhage or superficial siderosis. Vascular: The major intracranial arterial and venous sinus flow voids are preserved. Skull and upper cervical spine: The visualized skull base, calvarium, upper cervical spine and extracranial soft tissues are normal. Sinuses/Orbits: No fluid levels or advanced mucosal thickening of the visualized paranasal sinuses. No mastoid or middle ear effusion. The orbits are normal. Other: None MRI CERVICAL SPINE FINDINGS Alignment: Normal Vertebrae: Normal marrow signal Cord: Normal signal and morphology Posterior Fossa, vertebral arteries, paraspinal tissues: Visualized posterior fossa is normal. Vertebral artery flow voids are preserved. No prevertebral soft tissue swelling. Disc levels: C2-3: No disc herniation or stenosis. C3-4: Large right subarticular disc protrusion causing severe right neural foraminal stenosis and indenting the right ventral aspect of the spinal cord. No central spinal canal stenosis. No left foraminal stenosis. C4-5: Bilateral uncovertebral hypertrophy causing moderate bilateral neural foraminal stenosis, right greater than left. No spinal canal stenosis. C5-6: Intermediate disc osteophyte complex. No spinal canal stenosis. Moderate bilateral neural foraminal stenosis. C7-T1: Disc bulge without spinal canal or neural foraminal stenosis. C7-T1: Normal. Sagittal imaging at the T1-T4 levels is normal. IMPRESSION:  1. Mild chronic small vessel changes of the white matter without acute abnormality. 2. C3-4 large right subarticular disc protrusion severely narrowing the right neural foramen. 3. Moderate bilateral C4-5 and C5-6 neural foraminal stenosis due to the presence of disc osteophyte complexes. Electronically Signed   By: Deatra RobinsonKevin  Herman M.D.   On: 01/22/2019 23:03   Mr Cervical Spine Wo Contrast  Result Date: 01/22/2019 CLINICAL DATA:  Right arm numbness. EXAM: MRI HEAD WITHOUT CONTRAST MRI CERVICAL SPINE WITHOUT CONTRAST TECHNIQUE:  Multiplanar, multiecho pulse sequences of the brain and surrounding structures, and cervical spine, to include the craniocervical junction and cervicothoracic junction, were obtained without intravenous contrast. COMPARISON:  None. FINDINGS: MRI HEAD FINDINGS Brain: There is no acute infarct, acute hemorrhage or extra-axial collection. The midline structures are normal. There is no midline shift or mass effect. Mild white matter hyperintensity, most commonly due to chronic ischemic microangiopathy, though not unexpected for age. The cerebral and cerebellar volume are age-appropriate. There is no hydrocephalus. Susceptibility-sensitive sequences show no chronic microhemorrhage or superficial siderosis. Vascular: The major intracranial arterial and venous sinus flow voids are preserved. Skull and upper cervical spine: The visualized skull base, calvarium, upper cervical spine and extracranial soft tissues are normal. Sinuses/Orbits: No fluid levels or advanced mucosal thickening of the visualized paranasal sinuses. No mastoid or middle ear effusion. The orbits are normal. Other: None MRI CERVICAL SPINE FINDINGS Alignment: Normal Vertebrae: Normal marrow signal Cord: Normal signal and morphology Posterior Fossa, vertebral arteries, paraspinal tissues: Visualized posterior fossa is normal. Vertebral artery flow voids are preserved. No prevertebral soft tissue swelling. Disc levels: C2-3: No disc herniation or stenosis. C3-4: Large right subarticular disc protrusion causing severe right neural foraminal stenosis and indenting the right ventral aspect of the spinal cord. No central spinal canal stenosis. No left foraminal stenosis. C4-5: Bilateral uncovertebral hypertrophy causing moderate bilateral neural foraminal stenosis, right greater than left. No spinal canal stenosis. C5-6: Intermediate disc osteophyte complex. No spinal canal stenosis. Moderate bilateral neural foraminal stenosis. C7-T1: Disc bulge without spinal  canal or neural foraminal stenosis. C7-T1: Normal. Sagittal imaging at the T1-T4 levels is normal. IMPRESSION: 1. Mild chronic small vessel changes of the white matter without acute abnormality. 2. C3-4 large right subarticular disc protrusion severely narrowing the right neural foramen. 3. Moderate bilateral C4-5 and C5-6 neural foraminal stenosis due to the presence of disc osteophyte complexes. Electronically Signed   By: Deatra RobinsonKevin  Herman M.D.   On: 01/22/2019 23:03   Ct Abdomen Pelvis W Contrast  Result Date: 01/22/2019 CLINICAL DATA:  73 year old with chronic LEFT LOWER QUADRANT abdominal pain and diagnosis of low-grade acute diverticulitis 3 days ago, presenting now with worsening pain despite antibiotic therapy with ciprofloxacin and Flagyl. EXAM: CT ABDOMEN AND PELVIS WITH CONTRAST TECHNIQUE: Multidetector CT imaging of the abdomen and pelvis was performed using the standard protocol following bolus administration of intravenous contrast. CONTRAST:  100mL OMNIPAQUE IOHEXOL 300 MG/ML IV. Oral contrast was also administered. COMPARISON:  01/19/2019. FINDINGS: Lower chest: Mild dependent atelectasis in the lower lobes. Visualized lung bases otherwise clear. Heart moderately enlarged, unchanged. Hepatobiliary: Mild diffuse hepatic steatosis as noted 3 days ago. A focal eventration of the RIGHT POSTERIOR hemidiaphragm is favored over a pleural nodule at the base of the RIGHT hemithorax. Benign cyst involving the MEDIAL segment LEFT lobe of liver. No significant focal hepatic parenchymal abnormality. Surgically absent gallbladder. No unexpected biliary ductal dilation. Pancreas: Normal in appearance without evidence of mass, ductal dilation, or inflammation. Spleen: Normal in size and  appearance. Adrenals/Urinary Tract: Normal appearing adrenal glands. Kidneys normal in size and appearance without focal parenchymal abnormality. No hydronephrosis. No evidence of urinary tract calculi. Normal appearing urinary  bladder. Stomach/Bowel: Stomach normal in appearance for the degree of distention. Normal-appearing small bowel. Interval progression of acute inflammatory changes involving the proximal sigmoid colon since the CT 3 days ago. No extraluminal gas or abnormal fluid collection. No new areas of diverticulitis. Surgical clip at the cecal tip presumably related to prior appendectomy. Vascular/Lymphatic: Mild aortoiliac atherosclerosis without evidence of aneurysm. Normal-appearing portal venous and systemic venous systems. No pathologic lymphadenopathy. Reproductive: Normal-appearing uterus and ovaries without evidence of adnexal mass. Other: Numerous pelvic phleboliths. 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 BILATERAL L5 pars defects and grade 2 spondylolisthesis of L5 on S1 measuring approximately 9 mm. Facet degenerative changes at L4-5 and L5-S1. Degenerative disc disease and spondylosis involving the LOWER thoracic spine. No acute findings. IMPRESSION: 1. Mild interval progression of acute diverticulitis involving the proximal sigmoid colon since the CT 3 days ago. No evidence of perforation or abscess. 2. No acute abnormalities otherwise involving the abdomen or pelvis. 3. Mild diffuse hepatic steatosis. 4. BILATERAL L5 pars defects and grade 2 spondylolisthesis of L5 on S1 measuring approximately 9 mm. Aortic Atherosclerosis (ICD10-I70.0). Electronically Signed   By: Hulan Saashomas  Lawrence M.D.   On: 01/22/2019 19:38     CODE STATUS:     Code Status Orders  (From admission, onward)         Start     Ordered   01/22/19 2031  Full code  Continuous     01/22/19 2031        Code Status History    This patient has a current code status but no historical code status.   Advance Care Planning Activity    Advance Directive Documentation     Most Recent Value  Type of Advance Directive  Healthcare Power of Attorney  Pre-existing out of facility DNR order (yellow form or  pink MOST form)  -  "MOST" Form in Place?  -      TOTAL TIME TAKING CARE OF THIS PATIENT: *40* minutes.    Enedina FinnerSona Yazmyn Valbuena M.D on 01/24/2019 at 8:09 AM  Between 7am to 6pm - Pager - [phone] After 6pm go to [email or web address] - password [email or web address]  [facility]  Office  [phone]  CC: Primary care physician; Patient, No Pcp Per

## 2019-05-01 ENCOUNTER — Other Ambulatory Visit (HOSPITAL_COMMUNITY): Payer: Self-pay | Admitting: Neurology

## 2019-05-01 ENCOUNTER — Other Ambulatory Visit: Payer: Self-pay | Admitting: Neurology

## 2019-05-01 DIAGNOSIS — M5412 Radiculopathy, cervical region: Secondary | ICD-10-CM

## 2019-05-14 ENCOUNTER — Ambulatory Visit
Admission: RE | Admit: 2019-05-14 | Discharge: 2019-05-14 | Disposition: A | Discharge status: Home / Self Care | Payer: Medicare HMO | Source: Ambulatory Visit | Attending: Neurology | Admitting: Neurology

## 2019-05-14 ENCOUNTER — Other Ambulatory Visit: Payer: Self-pay

## 2019-05-14 DIAGNOSIS — M5412 Radiculopathy, cervical region: Secondary | ICD-10-CM | POA: Insufficient documentation

## 2019-06-12 ENCOUNTER — Other Ambulatory Visit: Payer: Self-pay | Admitting: Family Medicine

## 2019-06-12 DIAGNOSIS — M5412 Radiculopathy, cervical region: Secondary | ICD-10-CM

## 2019-06-14 ENCOUNTER — Ambulatory Visit
Admission: RE | Admit: 2019-06-14 | Discharge: 2019-06-14 | Disposition: A | Discharge status: Home / Self Care | Payer: Medicare HMO | Source: Ambulatory Visit | Attending: Family Medicine | Admitting: Family Medicine

## 2019-06-14 DIAGNOSIS — M5412 Radiculopathy, cervical region: Secondary | ICD-10-CM

## 2019-06-14 MED ORDER — TRIAMCINOLONE ACETONIDE 40 MG/ML IJ SUSP (RADIOLOGY)
60.0000 mg | Freq: Once | INTRAMUSCULAR | Status: AC
  Administered 2019-06-14: 14:00:00 60 mg via EPIDURAL

## 2019-06-14 MED ORDER — IOPAMIDOL (ISOVUE-M 300) INJECTION 61%
1.0000 mL | Freq: Once | INTRAMUSCULAR | Status: AC | PRN
  Administered 2019-06-14: 14:00:00 1 mL via EPIDURAL

## 2019-06-14 NOTE — Discharge Instructions (Signed)

## 2019-06-21 ENCOUNTER — Encounter: Payer: Self-pay | Admitting: *Deleted

## 2019-06-21 ENCOUNTER — Emergency Department
Admission: EM | Admit: 2019-06-21 | Discharge: 2019-06-22 | Disposition: A | Discharge status: Home / Self Care | Payer: Medicare HMO | Attending: Emergency Medicine | Admitting: Emergency Medicine

## 2019-06-21 ENCOUNTER — Emergency Department: Payer: Medicare HMO

## 2019-06-21 ENCOUNTER — Other Ambulatory Visit: Payer: Self-pay

## 2019-06-21 DIAGNOSIS — R6884 Jaw pain: Secondary | ICD-10-CM | POA: Diagnosis not present

## 2019-06-21 DIAGNOSIS — Z5321 Procedure and treatment not carried out due to patient leaving prior to being seen by health care provider: Secondary | ICD-10-CM | POA: Diagnosis not present

## 2019-06-21 DIAGNOSIS — M79601 Pain in right arm: Secondary | ICD-10-CM | POA: Insufficient documentation

## 2019-06-21 LAB — BASIC METABOLIC PANEL
Anion gap: 13 (ref 5–15)
BUN: 31 mg/dL — ABNORMAL HIGH (ref 8–23)
CO2: 22 mmol/L (ref 22–32)
Calcium: 9.2 mg/dL (ref 8.9–10.3)
Chloride: 102 mmol/L (ref 98–111)
Creatinine, Ser: 1.06 mg/dL — ABNORMAL HIGH (ref 0.44–1.00)
GFR calc Af Amer: >60 mL/min (ref >60)
GFR calc non Af Amer: 53 mL/min — ABNORMAL LOW (ref >60)
Glucose, Bld: 189 mg/dL — ABNORMAL HIGH (ref 70–99)
Potassium: 4.3 mmol/L (ref 3.5–5.1)
Sodium: 137 mmol/L (ref 135–145)

## 2019-06-21 LAB — CBC
HCT: 40.8 % (ref 36.0–46.0)
Hemoglobin: 13.6 g/dL (ref 12.0–15.0)
MCH: 30.2 pg (ref 26.0–34.0)
MCHC: 33.3 g/dL (ref 30.0–36.0)
MCV: 90.5 fL (ref 80.0–100.0)
Platelets: 229 10*3/uL (ref 150–400)
RBC: 4.51 MIL/uL (ref 3.87–5.11)
RDW: 13 % (ref 11.5–15.5)
WBC: 11.6 10*3/uL — ABNORMAL HIGH (ref 4.0–10.5)
nRBC: 0 % (ref 0.0–0.2)

## 2019-06-21 LAB — TROPONIN I (HIGH SENSITIVITY): Troponin I (High Sensitivity): 3 ng/L (ref <18)

## 2019-06-21 MED ORDER — SODIUM CHLORIDE 0.9% FLUSH: 3.0000 mL | Freq: Once | INTRAVENOUS | Status: DC

## 2019-06-21 NOTE — ED Triage Notes (Addendum)
Pt ambulatory to triage.  Pt has right arm and right jaw pain for 4 days.  Pt reports pain in right chest.  No sob.  No cough.  No n/v/d    Pt has chronic pain and goes to pain clinic.  Pt has a lido patch on right shoulder.  Pt alert  Speech clear.

## 2019-06-22 ENCOUNTER — Telehealth: Payer: Self-pay

## 2019-06-22 NOTE — ED Notes (Signed)
Family to the desk asking to leave and if they could get a follow up later. Notified Dr Owens Shark who was unavailable to come see pt. Pt is in no distress at this time. Family is very understanding and has received the results through my chart. Family will take pt to her PCP in the am.

## 2019-06-24 ENCOUNTER — Other Ambulatory Visit: Payer: Self-pay

## 2019-06-24 ENCOUNTER — Emergency Department: Payer: Medicare HMO

## 2019-06-24 ENCOUNTER — Emergency Department
Admission: EM | Admit: 2019-06-24 | Discharge: 2019-06-24 | Disposition: A | Discharge status: Home / Self Care | Payer: Medicare HMO | Attending: Emergency Medicine | Admitting: Emergency Medicine

## 2019-06-24 ENCOUNTER — Encounter: Payer: Self-pay | Admitting: Emergency Medicine

## 2019-06-24 DIAGNOSIS — Z79899 Other long term (current) drug therapy: Secondary | ICD-10-CM | POA: Diagnosis not present

## 2019-06-24 DIAGNOSIS — E119 Type 2 diabetes mellitus without complications: Secondary | ICD-10-CM | POA: Diagnosis not present

## 2019-06-24 DIAGNOSIS — M5412 Radiculopathy, cervical region: Secondary | ICD-10-CM | POA: Diagnosis not present

## 2019-06-24 DIAGNOSIS — R748 Abnormal levels of other serum enzymes: Secondary | ICD-10-CM | POA: Diagnosis not present

## 2019-06-24 DIAGNOSIS — Z7984 Long term (current) use of oral hypoglycemic drugs: Secondary | ICD-10-CM | POA: Diagnosis not present

## 2019-06-24 DIAGNOSIS — G629 Polyneuropathy, unspecified: Secondary | ICD-10-CM | POA: Diagnosis not present

## 2019-06-24 DIAGNOSIS — G8929 Other chronic pain: Secondary | ICD-10-CM | POA: Insufficient documentation

## 2019-06-24 DIAGNOSIS — I10 Essential (primary) hypertension: Secondary | ICD-10-CM | POA: Insufficient documentation

## 2019-06-24 DIAGNOSIS — M791 Myalgia, unspecified site: Secondary | ICD-10-CM | POA: Diagnosis present

## 2019-06-24 HISTORY — DX: Other cervical disc degeneration, unspecified cervical region: M50.30

## 2019-06-24 LAB — CBC WITH DIFFERENTIAL/PLATELET
Abs Immature Granulocytes: 0.07 10*3/uL (ref 0.00–0.07)
Basophils Absolute: 0 10*3/uL (ref 0.0–0.1)
Basophils Relative: 0 %
Eosinophils Absolute: 0 10*3/uL (ref 0.0–0.5)
Eosinophils Relative: 0 %
HCT: 40.6 % (ref 36.0–46.0)
Hemoglobin: 13.5 g/dL (ref 12.0–15.0)
Immature Granulocytes: 1 %
Lymphocytes Relative: 9 %
Lymphs Abs: 1 10*3/uL (ref 0.7–4.0)
MCH: 29.9 pg (ref 26.0–34.0)
MCHC: 33.3 g/dL (ref 30.0–36.0)
MCV: 89.8 fL (ref 80.0–100.0)
Monocytes Absolute: 0.5 10*3/uL (ref 0.1–1.0)
Monocytes Relative: 4 %
Neutro Abs: 9.5 10*3/uL — ABNORMAL HIGH (ref 1.7–7.7)
Neutrophils Relative %: 86 %
Platelets: 249 10*3/uL (ref 150–400)
RBC: 4.52 MIL/uL (ref 3.87–5.11)
RDW: 12.9 % (ref 11.5–15.5)
WBC: 11.1 10*3/uL — ABNORMAL HIGH (ref 4.0–10.5)
nRBC: 0 % (ref 0.0–0.2)

## 2019-06-24 LAB — HEPATIC FUNCTION PANEL
ALT: 15 U/L (ref 0–44)
AST: 12 U/L — ABNORMAL LOW (ref 15–41)
Albumin: 4.3 g/dL (ref 3.5–5.0)
Alkaline Phosphatase: 79 U/L (ref 38–126)
Bilirubin, Direct: 0.1 mg/dL (ref 0.0–0.2)
Indirect Bilirubin: 0.4 mg/dL (ref 0.3–0.9)
Total Bilirubin: 0.5 mg/dL (ref 0.3–1.2)
Total Protein: 7.6 g/dL (ref 6.5–8.1)

## 2019-06-24 LAB — LIPASE, BLOOD: Lipase: 80 U/L — ABNORMAL HIGH (ref 11–51)

## 2019-06-24 LAB — BASIC METABOLIC PANEL
Anion gap: 9 (ref 5–15)
BUN: 22 mg/dL (ref 8–23)
CO2: 20 mmol/L — ABNORMAL LOW (ref 22–32)
Calcium: 9.1 mg/dL (ref 8.9–10.3)
Chloride: 104 mmol/L (ref 98–111)
Creatinine, Ser: 0.78 mg/dL (ref 0.44–1.00)
GFR calc Af Amer: >60 mL/min (ref >60)
GFR calc non Af Amer: >60 mL/min (ref >60)
Glucose, Bld: 241 mg/dL — ABNORMAL HIGH (ref 70–99)
Potassium: 4.7 mmol/L (ref 3.5–5.1)
Sodium: 133 mmol/L — ABNORMAL LOW (ref 135–145)

## 2019-06-24 LAB — TROPONIN I (HIGH SENSITIVITY): Troponin I (High Sensitivity): 4 ng/L (ref <18)

## 2019-06-24 MED ORDER — GADOBUTROL 1 MMOL/ML IV SOLN
7.0000 mL | Freq: Once | INTRAVENOUS | Status: AC | PRN
  Administered 2019-06-24: 7 mL via INTRAVENOUS

## 2019-06-24 MED ORDER — HYDROCODONE-ACETAMINOPHEN 5-325 MG PO TABS
2.0000 | ORAL_TABLET | Freq: Once | ORAL | Status: AC
  Administered 2019-06-24: 2 via ORAL
  Filled 2019-06-24: qty 2

## 2019-06-24 MED ORDER — DROPERIDOL 2.5 MG/ML IJ SOLN: 1.2500 mg | Freq: Once | INTRAMUSCULAR | Status: DC | PRN

## 2019-06-24 MED ORDER — MORPHINE SULFATE (PF) 4 MG/ML IV SOLN
4.0000 mg | Freq: Once | INTRAVENOUS | Status: AC
  Administered 2019-06-24: 4 mg via INTRAVENOUS
  Filled 2019-06-24: qty 1

## 2019-06-24 MED ORDER — HYDROCODONE-ACETAMINOPHEN 5-325 MG PO TABS: 2.0000 | ORAL_TABLET | Freq: Four times a day (QID) | ORAL | 0 refills | Status: DC | PRN

## 2019-06-24 NOTE — ED Notes (Signed)
Per pt all pain feels "much better, but neck pain is still there"

## 2019-06-24 NOTE — Discharge Instructions (Addendum)
Fortunately we did not identify any new or worsening medical conditions tonight.  It is important that you let your doctors know that you received an MRI of your cervical spine with and without contrast so that you do not receive any unnecessary imaging in the future.  The results of your MRI were essentially unchanged from the last time you got an MRI.  The only lab work abnormality we identifies was a slightly elevated lipase which does not have anything to do with your current symptoms.  I recommend that you take all of the medications prescribed by your regular doctors.  You were provided with a prescription for severe pain over the weekend, but please remember that this is a one-time prescription and any additional strong pain medicine must come from one of your regular doctors or from the [facility] pain clinic.  Take Norco as prescribed for severe pain. Do not drink alcohol, drive or participate in any other potentially dangerous activities while taking this medication as it may make you sleepy. Do not take this medication with any other sedating medications, either prescription or over-the-counter. If you were prescribed Percocet or Vicodin, do not take these with acetaminophen (Tylenol) as it is already contained within these medications.   This medication is an opiate (or narcotic) pain medication and can be habit forming.  Use it as little as possible to achieve adequate pain control.  Do not use or use it with extreme caution if you have a history of opiate abuse or dependence.  If you are on a pain contract with your primary care doctor or a pain specialist, be sure to let them know you were prescribed this medication today from the [facility].  This medication is intended for your use only - do not give any to anyone else and keep it in a secure place where nobody else, especially children, have access to it.  It will also cause or worsen constipation, so you may want to  consider taking an over-the-counter stool softener while you are taking this medication.

## 2019-06-24 NOTE — ED Provider Notes (Signed)
[facility] Emergency Department Provider Note  ____________________________________________   First MD Initiated Contact with Patient 06/24/19 0131     (approximate)  I have reviewed the triage vital signs and the nursing notes.   HISTORY  Chief Complaint Generalized Body Aches  The patient and/or family speak(s) Spanish.  They understand they have the right to the use of a hospital interpreter, however at this time they prefer to speak directly with me in Spanish.  They know that they can ask for an interpreter at any time.   HPI Pamela Rios is a 73 y.o. female with medical issues as listed below which most relevantly includes some degenerative disc disease with a bulging disc either at C4-C5 or C5-C6 with a diagnosis of cervical polyradiculitis for which she sees Dr. Malvin Johns with neurology and Dr. Yves Dill with physiatry.  She reports chronic sharp and aching pains going from her neck down her right shoulder and down her right arm.  She also reports that recently it has been going down the front of her chest to her right breast and up her head.  She has a little bit of weakness in the right arm but it is difficult to appreciate if this is due to the pain or due to muscle weakness.  She was seen recently by Dr. Yves Dill and had injections and she said that this was a little over a week ago and since that time the pain has been much worse and steadily gotten worse until it was severe tonight and she had to come in.  She has an appointment in 2 days to follow-up but she could not last that long.  She was recently prescribed a prednisone taper and she has had the first dose of steroids and has not noted any improvement yet.  She has had no visual changes, sore throat, chest pain except for the pain radiating down into her right breast.  No shortness of breath or cough.  No abdominal pain.  One episode of vomiting earlier today associated with the severe pain.  She is  ambulatory without difficulty.  She has had no recent trauma.         Past Medical History:  Diagnosis Date  . DDD (degenerative disc disease), cervical   . Diabetes mellitus without complication (HCC)   . Diverticulitis   . Hyperlipidemia   . Hypertension   . Polyradiculitis 2019   cervical, since at least 2019    Patient Active Problem List   Diagnosis Date Noted  . Diverticulitis 01/22/2019    Past Surgical History:  Procedure Laterality Date  . KIDNEY STONE SURGERY    . KNEE SURGERY Right     Prior to Admission medications   Medication Sig Start Date End Date Taking? Authorizing Provider  albuterol (VENTOLIN HFA) 108 (90 Base) MCG/ACT inhaler Inhale 2 puffs into the lungs every 4 (four) hours as needed for wheezing. 08/30/18   [provider]  amoxicillin-clavulanate (AUGMENTIN) 875-125 MG tablet Take 1 tablet by mouth every 12 (twelve) hours. 01/24/19   Enedina Finner, MD  diclofenac (VOLTAREN) 75 MG EC tablet Take 75 mg twice daily with food for 1 month, then decrease to 75 mg once daily for 1 month, then discontinue.  No over the counter pain relievers except Tylenol 05/22/19   [provider]  gabapentin (NEURONTIN) 300 MG capsule Take by mouth.    [provider]  HYDROcodone-acetaminophen (NORCO/VICODIN) 5-325 MG tablet Take 2 tablets by mouth every 6 (  six) hours as needed for moderate pain or severe pain. 06/24/19   Loleta RoseForbach, Clella Mckeel, MD  lisinopril (ZESTRIL) 5 MG tablet Take 5 mg by mouth daily.  12/27/18   [provider]  metFORMIN (GLUCOPHAGE) 500 MG tablet Take 500 mg by mouth daily with breakfast.  12/27/18   [provider]  simvastatin (ZOCOR) 20 MG tablet Take 20 mg by mouth daily at 6 PM.     [provider]    Allergies Sulfa antibiotics  Family History  Problem Relation Age of Onset  . Breast cancer Mother   . CVA Mother   . Diabetes Mother   . Hypertension Mother   . CAD Father     Social  History Social History   Tobacco Use  . Smoking status: Never Smoker  . Smokeless tobacco: Never Used  Substance Use Topics  . Alcohol use: Not Currently  . Drug use: Never    Review of Systems Constitutional: No fever/chills Eyes: No visual changes. ENT: No sore throat. Cardiovascular: Pain radiating from neck to the right breast, otherwise no chest pain. Respiratory: Denies shortness of breath. Gastrointestinal: 1 episode of nausea vomiting earlier today.  No abdominal pain.  No nausea, no vomiting.   Genitourinary: Negative for dysuria. Musculoskeletal: Acute on chronic severe neck pain primarily on the right side that radiates both up into her head and down to her right arm and possibly into the right side of her torso. Integumentary: Negative for rash. Neurological: Negative for headaches, focal weakness or numbness.   ____________________________________________   PHYSICAL EXAM:  VITAL SIGNS: ED Triage Vitals  Enc Vitals Group     BP 06/24/19 0047 (!) 118/103     Pulse Rate 06/24/19 0047 99     Resp 06/24/19 0047 18     Temp 06/24/19 0047 98.4 F (36.9 C)     Temp Source 06/24/19 0047 Oral     SpO2 06/24/19 0047 98 %     Weight 06/24/19 0101 70.3 kg (155 lb)     Height 06/24/19 0101 1.549 m (5\' 1" )     Head Circumference --      Peak Flow --      Pain Score 06/24/19 0101 10     Pain Loc --      Pain Edu? --      Excl. in GC? --     Constitutional: Alert and oriented.  Tearful, appears to be in pain, cannot find a position of comfort. Eyes: Conjunctivae are normal.  Head: Atraumatic. Nose: No congestion/rhinnorhea. Mouth/Throat: Patient is wearing a mask. Neck: No stridor.  No meningeal signs.   Cardiovascular: Normal rate, regular rhythm. Good peripheral circulation. Grossly normal heart sounds. Respiratory: Normal respiratory effort.  No retractions. Gastrointestinal: Soft and nontender. No distention.  Musculoskeletal: No lower extremity tenderness  nor edema. No gross deformities of extremities. Neurologic:  Normal speech and language.  The patient reports pain from her neck down her right arm.  She has normal range of motion of her arm and hand.  She is right-hand dominant.  She has decreased grip strength and decreased major muscle group strength in the right arm but it is difficult to appreciate if this is secondary to effort and pain or if it is truly a neurological limitation. Skin:  Skin is warm, dry and intact. Psychiatric: Mood and affect are appropriate for acute on chronic pain.   ____________________________________________   LABS (all labs ordered are listed, but only abnormal results are displayed)  Labs Reviewed  CBC WITH DIFFERENTIAL/PLATELET - Abnormal; Notable for the following components:      Result Value   WBC 11.1 (*)    Neutro Abs 9.5 (*)    All other components within normal limits  BASIC METABOLIC PANEL - Abnormal; Notable for the following components:   Sodium 133 (*)    CO2 20 (*)    Glucose, Bld 241 (*)    All other components within normal limits  HEPATIC FUNCTION PANEL - Abnormal; Notable for the following components:   AST 12 (*)    All other components within normal limits  LIPASE, BLOOD - Abnormal; Notable for the following components:   Lipase 80 (*)    All other components within normal limits   ____________________________________________  EKG  ED ECG REPORT I, Hinda Kehr, the attending physician, personally viewed and interpreted this ECG.  Date: 06/24/2019 EKG Time: 00: 40 Rate: 96 Rhythm: normal sinus rhythm QRS Axis: Left axis deviation Intervals: Left bundle branch block, chronic ST/T Wave abnormalities: Non-specific ST segment / T-wave changes, but no clear evidence of acute ischemia. Narrative Interpretation: no definitive evidence of acute ischemia; does not meet STEMI criteria.  No significant change from prior  EKG.   ____________________________________________  RADIOLOGY Ursula Alert, personally viewed and evaluated these images (plain radiographs) as part of my medical decision making, as well as reviewing the written report by the radiologist.  ED MD interpretation:  Stable from prior with some nerve root impingement but no evidence of infection and no acute/emergent neurosurgical condition.  Official radiology report(s): MR Cervical Spine W or Wo Contrast  Result Date: 06/24/2019 CLINICAL DATA:  Initial evaluation for acute neck pain, worsened after recent spinal injection. EXAM: MRI CERVICAL SPINE WITHOUT AND WITH CONTRAST TECHNIQUE: Multiplanar and multiecho pulse sequences of the cervical spine, to include the craniocervical junction and cervicothoracic junction, were obtained without and with intravenous contrast. CONTRAST:  28mL GADAVIST GADOBUTROL 1 MMOL/ML IV SOLN COMPARISON:  Prior MRI from 05/14/2019. FINDINGS: Alignment: Straightening of the normal cervical lordosis. No listhesis or subluxation. Vertebrae: Vertebral body height maintained without evidence for acute or chronic fracture. Bone marrow signal intensity within normal limits. No discrete or worrisome osseous lesions. No abnormal marrow edema or enhancement. No evidence for acute osteomyelitis discitis or septic arthritis. Cord: Signal intensity within the cervical spinal cord is normal. Normal cord caliber morphology. No epidural abscess or other collection. No abnormal enhancement. Posterior Fossa, vertebral arteries, paraspinal tissues: Visualized brain and posterior fossa within normal limits. Craniocervical junction normal. Paraspinous and prevertebral soft tissues within normal limits. Normal intravascular flow voids seen within the vertebral arteries bilaterally. Disc levels: C2-C3: Unremarkable. C3-C4: Small right subarticular disc osteophyte complex indents the right ventral thecal sac, encroaching upon the proximal right  neural foramen. Minimal flattening of the right ventral cord. The ventral right C4 nerve root could be affected. This is stable from previous. No significant spinal stenosis. Foramina remain otherwise patent. C4-C5: Mild disc bulge with uncovertebral hypertrophy. No significant spinal stenosis. Mild bilateral C5 foraminal narrowing, unchanged. No impingement. C5-C6: Chronic intervertebral disc space narrowing with diffuse degenerative disc osteophyte. Flattening of the ventral thecal sac without significant spinal stenosis. Moderate bilateral C6 foraminal stenosis, unchanged. C6-C7: Shallow central disc protrusion, slightly asymmetric to the left, minimally indents the ventral thecal sac. No significant spinal stenosis or cord impingement. Superimposed mild uncovertebral hypertrophy without significant foraminal stenosis. C7-T1: Normal interspace. Right-sided facet degeneration. No significant canal or foraminal stenosis. Visualized upper thoracic spine  demonstrates no significant finding. IMPRESSION: 1. No acute abnormality identified within the cervical spine. No evidence for acute infection or other complication status post recent injection. 2. Shallow right subarticular disc osteophyte complex at C3-4, potentially affecting the ventral right C4 nerve root, stable. 3. Degenerative spondylosis at C4-5 and C5-6 with resultant mild to moderate bilateral foraminal stenosis, unchanged. Electronically Signed   By: Rise Mu M.D.   On: 06/24/2019 04:08    ____________________________________________   PROCEDURES   Procedure(s) performed (including Critical Care):  Procedures   ____________________________________________   INITIAL IMPRESSION / MDM / ASSESSMENT AND PLAN / ED COURSE  As part of my medical decision making, I reviewed the following data within the electronic MEDICAL RECORD NUMBER History obtained from family, Nursing notes reviewed and incorporated, Labs reviewed , EKG interpreted ,  Old chart reviewed, Notes from prior ED visits and [place] Controlled Substance Database   Differential diagnosis includes, but is not limited to, acute on chronic pain secondary to cervical polyradiculitis, acute infection secondary to recent spinal injections, neoplasm or other mass on the spine.  No indication of CVA as the symptoms are all pain related.  I very much do not think she is suffering from an acute intrathoracic issues such as ACS or pulmonary embolism given that the pain is radiating from her neck and she said that sometimes it radiates down into her right breast.  I explained to her that I do not understand this completely based on the nerve distribution but she very much associates the two symptoms.  Her primary issue is the neck/shoulder/arm pain.  I reviewed the medical record and saw notes from Dr. Malvin Johns as well as Dr. Yves Dill.  I also discussed the situation with the patient's permission with her daughter.  Lab work is notable for a very mild leukocytosis.  Hepatic function panel is essentially normal.  Lipase is slightly elevated at 50 which is difficult to interpret given that she is not having any abdominal pain and had only one episode of vomiting earlier.  Basic metabolic panel has no significant abnormalities.  The patient has no gross neurological deficits although she could have some decreased strength in the right arm but it is difficult to appreciate if this is voluntary or due to true limitation.  Given her recent injections after which the symptoms seem to be worse, as well as the known disc protrusion in the cervical spine with worsening radiculopathy, I will proceed with an MRI with and without contrast to look for any sign of neurosurgical emergency including epidural abscess, inflammation, cellulitis, transverse myelitis, osteomyelitis.  I am giving her a dose of morphine 4 mg IV.  She may require some additional medication prior to MRI to allow for pain control and for her  to be still for the imaging.  The patient and her daughter understand and agree with the plan.      Clinical Course as of Jun 23 424  Sun Jun 24, 2019  0413 Stable from prior, no acute abnormalities identified with no sign of infection or worsening nerve root impingement.  I will plan to discharge the patient with a short course of Norco for her to follow-up with her doctors as scheduled including her appointment in 2 days.  MR Cervical Spine W or Wo Contrast [CF]  0414 I will give her two Norco tonight prior to discharge.   [CF]  [phone] Of note, the patient reports substantial pain improvement after the dose of morphine 4 mg IV.  She has no longer distress, no longer tearful, appears comfortable.   [CF]    Clinical Course User Index [CF] Loleta Rose, MD     ____________________________________________  FINAL CLINICAL IMPRESSION(S) / ED DIAGNOSES  Final diagnoses:  Other chronic pain  Polyradiculitis  Cervical radiculopathy  Elevated lipase     MEDICATIONS GIVEN DURING THIS VISIT:  Medications  droperidol (INAPSINE) 2.5 MG/ML injection 1.25 mg (has no administration in time range)  HYDROcodone-acetaminophen (NORCO/VICODIN) 5-325 MG per tablet 2 tablet (has no administration in time range)  morphine 4 MG/ML injection 4 mg (4 mg Intravenous Given 06/24/19 0304)  gadobutrol (GADAVIST) 1 MMOL/ML injection 7 mL (7 mLs Intravenous Contrast Given 06/24/19 0346)     ED Discharge Orders         Ordered    HYDROcodone-acetaminophen (NORCO/VICODIN) 5-325 MG tablet  Every 6 hours PRN     06/24/19 0424          *Please note:  Pamela Rios was evaluated in Emergency Department on 06/24/2019 for the symptoms described in the history of present illness. She was evaluated in the context of the global COVID-19 pandemic, which necessitated consideration that the patient might be at risk for infection with the SARS-CoV-2 virus that causes COVID-19. Institutional protocols and  algorithms that pertain to the evaluation of patients at risk for COVID-19 are in a state of rapid change based on information released by regulatory bodies including the CDC and federal and state organizations. These policies and algorithms were followed during the patient's care in the ED.  Some ED evaluations and interventions may be delayed as a result of limited staffing during the pandemic.*  Note:  This document was prepared using Dragon voice recognition software and may include unintentional dictation errors.   Loleta Rose, MD 06/24/19 [phone]

## 2019-06-24 NOTE — ED Notes (Signed)
Patient transported to MRI 

## 2019-06-24 NOTE — ED Notes (Addendum)
Peripheral IV discontinued. Catheter intact. No signs of infiltration or redness. Gauze applied to IV site.    Discharge instructions reviewed with patient. Questions fielded by this RN. Patient verbalizes understanding of instructions. Patient discharged home in stable condition per [place]. No acute distress noted at time of discharge.    Instructions for meds and follow up discussed with pt and family in parking lot - daughter and family verbalized acknowledgment

## 2019-06-24 NOTE — ED Triage Notes (Signed)
Pt is tearful at this time in triage. Pt states that she is having head, neck, abdominal, and right arm pain. Pt was placed on prednisone but has not taken any at this time. Pt has clear speech at this time in triage.

## 2020-08-11 ENCOUNTER — Emergency Department: Payer: Medicare Other

## 2020-08-11 ENCOUNTER — Other Ambulatory Visit: Payer: Self-pay

## 2020-08-11 ENCOUNTER — Emergency Department
Admission: EM | Admit: 2020-08-11 | Discharge: 2020-08-11 | Disposition: A | Discharge status: Home / Self Care | Payer: Medicare Other | Attending: Emergency Medicine | Admitting: Emergency Medicine

## 2020-08-11 DIAGNOSIS — Z7984 Long term (current) use of oral hypoglycemic drugs: Secondary | ICD-10-CM | POA: Diagnosis not present

## 2020-08-11 DIAGNOSIS — J9801 Acute bronchospasm: Secondary | ICD-10-CM

## 2020-08-11 DIAGNOSIS — R0602 Shortness of breath: Secondary | ICD-10-CM | POA: Insufficient documentation

## 2020-08-11 DIAGNOSIS — R0789 Other chest pain: Secondary | ICD-10-CM | POA: Insufficient documentation

## 2020-08-11 DIAGNOSIS — I10 Essential (primary) hypertension: Secondary | ICD-10-CM | POA: Insufficient documentation

## 2020-08-11 DIAGNOSIS — E119 Type 2 diabetes mellitus without complications: Secondary | ICD-10-CM | POA: Insufficient documentation

## 2020-08-11 DIAGNOSIS — Z79899 Other long term (current) drug therapy: Secondary | ICD-10-CM | POA: Diagnosis not present

## 2020-08-11 DIAGNOSIS — R059 Cough, unspecified: Secondary | ICD-10-CM | POA: Diagnosis not present

## 2020-08-11 LAB — HEPATIC FUNCTION PANEL
ALT: 13 U/L (ref 0–44)
AST: 14 U/L — ABNORMAL LOW (ref 15–41)
Albumin: 4.1 g/dL (ref 3.5–5.0)
Alkaline Phosphatase: 73 U/L (ref 38–126)
Bilirubin, Direct: 0.1 mg/dL (ref 0.0–0.2)
Indirect Bilirubin: 0.6 mg/dL (ref 0.3–0.9)
Total Bilirubin: 0.7 mg/dL (ref 0.3–1.2)
Total Protein: 7 g/dL (ref 6.5–8.1)

## 2020-08-11 LAB — CBC
HCT: 40.9 % (ref 36.0–46.0)
Hemoglobin: 13.6 g/dL (ref 12.0–15.0)
MCH: 30.4 pg (ref 26.0–34.0)
MCHC: 33.3 g/dL (ref 30.0–36.0)
MCV: 91.5 fL (ref 80.0–100.0)
Platelets: 179 10*3/uL (ref 150–400)
RBC: 4.47 MIL/uL (ref 3.87–5.11)
RDW: 12.7 % (ref 11.5–15.5)
WBC: 7.6 10*3/uL (ref 4.0–10.5)
nRBC: 0 % (ref 0.0–0.2)

## 2020-08-11 LAB — LIPASE, BLOOD: Lipase: 35 U/L (ref 11–51)

## 2020-08-11 LAB — BASIC METABOLIC PANEL
Anion gap: 9 (ref 5–15)
BUN: 14 mg/dL (ref 8–23)
CO2: 24 mmol/L (ref 22–32)
Calcium: 9.2 mg/dL (ref 8.9–10.3)
Chloride: 106 mmol/L (ref 98–111)
Creatinine, Ser: 0.57 mg/dL (ref 0.44–1.00)
GFR, Estimated: >60 mL/min (ref >60)
Glucose, Bld: 144 mg/dL — ABNORMAL HIGH (ref 70–99)
Potassium: 3.9 mmol/L (ref 3.5–5.1)
Sodium: 139 mmol/L (ref 135–145)

## 2020-08-11 LAB — TROPONIN I (HIGH SENSITIVITY)
Troponin I (High Sensitivity): <2 ng/L (ref <18)
Troponin I (High Sensitivity): 3 ng/L (ref <18)

## 2020-08-11 MED ORDER — PREDNISONE 20 MG PO TABS: 40.0000 mg | ORAL_TABLET | Freq: Every day | ORAL | 0 refills | Status: AC

## 2020-08-11 MED ORDER — ALBUTEROL SULFATE (2.5 MG/3ML) 0.083% IN NEBU
2.5000 mg | INHALATION_SOLUTION | Freq: Once | RESPIRATORY_TRACT | Status: AC
  Administered 2020-08-11: 2.5 mg via RESPIRATORY_TRACT
  Filled 2020-08-11: qty 3

## 2020-08-11 MED ORDER — KETOROLAC TROMETHAMINE 30 MG/ML IJ SOLN
15.0000 mg | Freq: Once | INTRAMUSCULAR | Status: AC
  Administered 2020-08-11: 15 mg via INTRAVENOUS
  Filled 2020-08-11: qty 1

## 2020-08-11 MED ORDER — IOHEXOL 350 MG/ML SOLN
75.0000 mL | Freq: Once | INTRAVENOUS | Status: AC | PRN
  Administered 2020-08-11: 75 mL via INTRAVENOUS

## 2020-08-11 MED ORDER — AZITHROMYCIN 250 MG PO TABS: ORAL_TABLET | ORAL | 0 refills | Status: AC

## 2020-08-11 MED ORDER — ALBUTEROL SULFATE HFA 108 (90 BASE) MCG/ACT IN AERS: INHALATION_SPRAY | RESPIRATORY_TRACT | 0 refills | Status: DC

## 2020-08-11 NOTE — ED Triage Notes (Addendum)
Pt comes with c/o SOB, dizziness and CP. Family states yesterday pt has an episode where she stopped breathing.  Pt states mid sternal CP. Pt states some radiation to back. Pt denies any N/V/D  Pt states tightness in chest.

## 2020-08-11 NOTE — Discharge Instructions (Addendum)
Take the steroids and antibiotics as prescribed.  For your pain, I recommend taking over-the-counter Tylenol 1000 mg every 6 hours as needed, and Motrin 400 mg every 6-8 hours as needed.  The steroids and antibiotics will help with your breathing.  Use the albuterol inhaler, 2 puffs, every 4-6 hours while awake for the next 1 to 2 days.  Then use this as needed.  If your symptoms do not improve, call your doctor to set up possible outpatient cardiac/Holter monitoring to evaluate for possible heart rhythm changes.

## 2020-08-11 NOTE — ED Provider Notes (Signed)
[facility] Emergency Department Provider Note  ____________________________________________   Event Date/Time   First MD Initiated Contact with Patient 08/11/20 1503     (approximate)  I have reviewed the triage vital signs and the nursing notes.   HISTORY  Chief Complaint Chest Pain and Shortness of Breath    HPI Pamela Rios is a 75 y.o. female  With h/o HTN, HLD, diverticulitis, here with chest pain, cough.  Patient states that she was in her usual state of health until yesterday.  She was bending down to do laundry when she felt acute onset of chest pain and pressure.  She felt she could not catch her breath and like she could not breathe.  She states the pain was pressure-like, but also worse with movement and palpation.  He did radiate towards her back.  Since then, the pain has been fairly persistent though she has been able to breathe.  She states that the severe symptom of feeling like she cannot breathe lasted no more than 2 to 3 minutes.  She denies any cough, until after the episode.  Denies any recent fevers or chills.  No history of similar symptoms.  She did feel like her heart was beating quickly during the episode.  No known history of coronary disease.  No diaphoresis.  No known history of DVT/PE.  No recent mobilization.  No specific alleviating or aggravating factors today.   Past Medical History:  Diagnosis Date  . DDD (degenerative disc disease), cervical   . Diabetes mellitus without complication (HCC)   . Diverticulitis   . Hyperlipidemia   . Hypertension   . Polyradiculitis 2019   cervical, since at least 2019    Patient Active Problem List   Diagnosis Date Noted  . Diverticulitis 01/22/2019    Past Surgical History:  Procedure Laterality Date  . KIDNEY STONE SURGERY    . KNEE SURGERY Right     Prior to Admission medications   Medication Sig Start Date End Date Taking? Authorizing Provider  albuterol (VENTOLIN HFA) 108  (90 Base) MCG/ACT inhaler Inhale 2 puffs into the lungs every 4 (four) hours as needed for wheezing. 08/30/18   [provider]  amoxicillin-clavulanate (AUGMENTIN) 875-125 MG tablet Take 1 tablet by mouth every 12 (twelve) hours. 01/24/19   Enedina Finner, MD  diclofenac (VOLTAREN) 75 MG EC tablet Take 75 mg twice daily with food for 1 month, then decrease to 75 mg once daily for 1 month, then discontinue.  No over the counter pain relievers except Tylenol 05/22/19   [provider]  gabapentin (NEURONTIN) 300 MG capsule Take by mouth.    [provider]  HYDROcodone-acetaminophen (NORCO/VICODIN) 5-325 MG tablet Take 2 tablets by mouth every 6 (six) hours as needed for moderate pain or severe pain. 06/24/19   Loleta Rose, MD  lisinopril (ZESTRIL) 5 MG tablet Take 5 mg by mouth daily.  12/27/18   [provider]  metFORMIN (GLUCOPHAGE) 500 MG tablet Take 500 mg by mouth daily with breakfast.  12/27/18   [provider]  simvastatin (ZOCOR) 20 MG tablet Take 20 mg by mouth daily at 6 PM.     [provider]    Allergies Sulfa antibiotics  Family History  Problem Relation Age of Onset  . Breast cancer Mother   . CVA Mother   . Diabetes Mother   . Hypertension Mother   . CAD Father     Social History Social History   Tobacco  Use  . Smoking status: Never Smoker  . Smokeless tobacco: Never Used  Vaping Use  . Vaping Use: Never used  Substance Use Topics  . Alcohol use: Not Currently  . Drug use: Never    Review of Systems  Review of Systems  Constitutional: Positive for fatigue. Negative for fever.  HENT: Negative for congestion and sore throat.   Eyes: Negative for visual disturbance.  Respiratory: Positive for chest tightness and shortness of breath. Negative for cough.   Cardiovascular: Positive for chest pain.  Gastrointestinal: Negative for abdominal pain, diarrhea, nausea and vomiting.  Genitourinary: Negative for flank  pain.  Musculoskeletal: Negative for back pain and neck pain.  Skin: Negative for rash and wound.  Neurological: Negative for weakness.  All other systems reviewed and are negative.    ____________________________________________  PHYSICAL EXAM:      VITAL SIGNS: ED Triage Vitals  Enc Vitals Group     BP 08/11/20 1332 (!) 155/66     Pulse Rate 08/11/20 1332 66     Resp 08/11/20 1332 19     Temp 08/11/20 1332 98.3 F (36.8 C)     Temp src --      SpO2 08/11/20 1332 98 %     Weight --      Height --      Head Circumference --      Peak Flow --      Pain Score 08/11/20 1328 7     Pain Loc --      Pain Edu? --      Excl. in GC? --      Physical Exam Vitals and nursing note reviewed.  Constitutional:      General: She is not in acute distress.    Appearance: She is well-developed.  HENT:     Head: Normocephalic and atraumatic.  Eyes:     Conjunctiva/sclera: Conjunctivae normal.  Cardiovascular:     Rate and Rhythm: Normal rate and regular rhythm.     Heart sounds: Normal heart sounds. No murmur heard. No friction rub.  Pulmonary:     Effort: Pulmonary effort is normal. No respiratory distress.     Breath sounds: Normal breath sounds. No wheezing or rales.  Abdominal:     General: There is no distension.     Palpations: Abdomen is soft.     Tenderness: There is no abdominal tenderness.  Musculoskeletal:     Cervical back: Neck supple.  Skin:    General: Skin is warm.     Capillary Refill: Capillary refill takes less than 2 seconds.  Neurological:     Mental Status: She is alert and oriented to person, place, and time.     Motor: No abnormal muscle tone.       ____________________________________________   LABS (all labs ordered are listed, but only abnormal results are displayed)  Labs Reviewed  BASIC METABOLIC PANEL - Abnormal; Notable for the following components:      Result Value   Glucose, Bld 144 (*)    All other components within normal limits   HEPATIC FUNCTION PANEL - Abnormal; Notable for the following components:   AST 14 (*)    All other components within normal limits  CBC  LIPASE, BLOOD  TROPONIN I (HIGH SENSITIVITY)  TROPONIN I (HIGH SENSITIVITY)    ____________________________________________  EKG: Normal sinus rhythm, VR 71. PR 188, QRS 142, QTc 452. No acute ST elevations or depressions. No ischemia or infarct. ________________________________________  RADIOLOGY All imaging,  including plain films, CT scans, and ultrasounds, independently reviewed by me, and interpretations confirmed via formal radiology reads.  ED MD interpretation:   Chest x-ray: Mild bibasilar atelectasis  Official radiology report(s): DG Chest 2 View  Result Date: 08/11/2020 CLINICAL DATA:  Chest pain short of breath dizziness EXAM: CHEST - 2 VIEW COMPARISON:  06/21/2019 FINDINGS: Mild cardiac enlargement.  Negative for heart failure. Mild bibasilar atelectasis. Negative for effusion. No acute skeletal abnormality. IMPRESSION: Mild bibasilar atelectasis. Electronically Signed   By: Marlan Palau M.D.   On: 08/11/2020 14:26   CT Angio Chest PE W and/or Wo Contrast  Result Date: 08/11/2020 CLINICAL DATA:  Chest pain and shortness of breath, PE suspected EXAM: CT ANGIOGRAPHY CHEST WITH CONTRAST TECHNIQUE: Multidetector CT imaging of the chest was performed using the standard protocol during bolus administration of intravenous contrast. Multiplanar CT image reconstructions and MIPs were obtained to evaluate the vascular anatomy. CONTRAST:  65mL OMNIPAQUE IOHEXOL 350 MG/ML SOLN COMPARISON:  Radiograph 08/11/2020 FINDINGS: Cardiovascular: Satisfactory opacification the pulmonary arteries to the segmental level. No pulmonary artery filling defects are identified. Central pulmonary arteries are normal caliber. Normal heart size. No pericardial effusion. No gross acute aortic abnormality. No periaortic stranding or hemorrhage. Thoracic aorta is top-normal  caliber. Shared origin of the brachiocephalic and left common carotid arteries. Minimal calcification in the proximal great vessels. No major venous abnormalities. Mediastinum/Nodes: No mediastinal fluid or gas. Normal thyroid gland and thoracic inlet. No acute abnormality of the trachea or esophagus. No worrisome mediastinal, hilar or axillary adenopathy. Lungs/Pleura: Low lung volumes and atelectatic changes. Additional areas of mosaic attenuation may reflect some further volume loss accentuated by imaging during exhalation versus air trapping or small airways disease. Subpleural 5 mm nodule in the posterior segment right upper lobe (6/30). Additional small 3 and 4 mm nodule seen in the right middle lobe (6/50, forty-eight). No other concerning pulmonary nodules or masses. Upper Abdomen: Prior cholecystectomy. No acute abnormalities present in the visualized portions of the upper abdomen. Musculoskeletal: Degenerative changes are present in the imaged spine and shoulders. Mild dextrocurvature of the thoracic levels. No acute osseous abnormality or suspicious osseous lesion. No worrisome chest wall masses or lesions. Review of the MIP images confirms the above findings. IMPRESSION: 1. No evidence of pulmonary embolism. 2. Low lung volumes and atelectatic changes. Additional areas of mosaic attenuation may reflect some further volume loss accentuated by imaging during exhalation versus air trapping or small airways disease. 3. Multiple right-sided pulmonary nodules measuring up to 5 mm in the posterior segment right upper lobe. No follow-up needed if patient is low-risk (and has no known or suspected primary neoplasm). Non-contrast chest CT can be considered in 12 months if patient is high-risk. This recommendation follows the consensus statement: Guidelines for Management of Incidental Pulmonary Nodules Detected on CT Images: From the Fleischner Society 2017; Radiology 2017; 284:228-243. Electronically Signed   By:  Kreg Shropshire M.D.   On: 08/11/2020 16:37    ____________________________________________  PROCEDURES   Procedure(s) performed (including Critical Care):  .1-3 Lead EKG Interpretation Performed by: Shaune Pollack, MD Authorized by: Shaune Pollack, MD     Interpretation: normal     ECG rate:  60-80   ECG rate assessment: normal     Rhythm: sinus rhythm     Ectopy: none     Conduction: normal   Comments:     Indication: SOB, chest pain    ____________________________________________  INITIAL IMPRESSION / MDM / ASSESSMENT AND PLAN / ED  COURSE  As part of my medical decision making, I reviewed the following data within the electronic MEDICAL RECORD NUMBER Nursing notes reviewed and incorporated, Old chart reviewed, Notes from prior ED visits, and [place] Controlled Substance Database       *Pamela Rios was evaluated in Emergency Department on 08/11/2020 for the symptoms described in the history of present illness. She was evaluated in the context of the global COVID-19 pandemic, which necessitated consideration that the patient might be at risk for infection with the SARS-CoV-2 virus that causes COVID-19. Institutional protocols and algorithms that pertain to the evaluation of patients at risk for COVID-19 are in a state of rapid change based on information released by regulatory bodies including the CDC and federal and state organizations. These policies and algorithms were followed during the patient's care in the ED.  Some ED evaluations and interventions may be delayed as a result of limited staffing during the pandemic.*     Medical Decision Making: 75 year old female here with atypical right-sided chest pain and shortness of breath.  On arrival, patient is afebrile and in no distress.  EKG is nonischemic.  Troponins are negative x2, EKG shows old left bundle branch block without scar Boso criteria and I do not suspect ACS.  Given the acuity of onset of her symptoms, CT angio obtained  and reviewed.  CT shows no evidence of PE.  There does seem to be small airway disease and lung nodules.  She has no smoking history so is low risk.  She feels markedly improved after being given albuterol here, I suspect there could be a component of bronchospasm contributing to her shortness of breath.  Unclear etiology of her pain other than possibly related to this bronchospasm versus pleurisy versus transient musculoskeletal pain.  No apparent emergent medical pathology.  She is satting well on room air.  Will discharge with steroids, scheduled albuterol, and outpatient follow-up. No arrhythmia on telemetry in ED.  ____________________________________________  FINAL CLINICAL IMPRESSION(S) / ED DIAGNOSES  Final diagnoses:  None     MEDICATIONS GIVEN DURING THIS VISIT:  Medications  iohexol (OMNIPAQUE) 350 MG/ML injection 75 mL (75 mLs Intravenous Contrast Given 08/11/20 1620)  albuterol (PROVENTIL) (2.5 MG/3ML) 0.083% nebulizer solution 2.5 mg (2.5 mg Nebulization Given 08/11/20 1703)  ketorolac (TORADOL) 30 MG/ML injection 15 mg (15 mg Intravenous Given 08/11/20 1703)     ED Discharge Orders    None       Note:  This document was prepared using Dragon voice recognition software and may include unintentional dictation errors.   Shaune Pollack, MD 08/11/20 [phone]

## 2020-10-11 ENCOUNTER — Emergency Department
Admission: EM | Admit: 2020-10-11 | Discharge: 2020-10-12 | Disposition: A | Discharge status: Home / Self Care | Payer: Medicare Other | Attending: Emergency Medicine | Admitting: Emergency Medicine

## 2020-10-11 ENCOUNTER — Other Ambulatory Visit: Payer: Self-pay

## 2020-10-11 DIAGNOSIS — E119 Type 2 diabetes mellitus without complications: Secondary | ICD-10-CM | POA: Insufficient documentation

## 2020-10-11 DIAGNOSIS — Z7984 Long term (current) use of oral hypoglycemic drugs: Secondary | ICD-10-CM | POA: Diagnosis not present

## 2020-10-11 DIAGNOSIS — Z79899 Other long term (current) drug therapy: Secondary | ICD-10-CM | POA: Insufficient documentation

## 2020-10-11 DIAGNOSIS — M5412 Radiculopathy, cervical region: Secondary | ICD-10-CM | POA: Diagnosis not present

## 2020-10-11 DIAGNOSIS — R42 Dizziness and giddiness: Secondary | ICD-10-CM | POA: Diagnosis not present

## 2020-10-11 DIAGNOSIS — I10 Essential (primary) hypertension: Secondary | ICD-10-CM | POA: Insufficient documentation

## 2020-10-11 DIAGNOSIS — M542 Cervicalgia: Secondary | ICD-10-CM | POA: Diagnosis present

## 2020-10-11 LAB — BASIC METABOLIC PANEL
Anion gap: 9 (ref 5–15)
BUN: 23 mg/dL (ref 8–23)
CO2: 28 mmol/L (ref 22–32)
Calcium: 9.6 mg/dL (ref 8.9–10.3)
Chloride: 103 mmol/L (ref 98–111)
Creatinine, Ser: 0.97 mg/dL (ref 0.44–1.00)
GFR, Estimated: >60 mL/min (ref >60)
Glucose, Bld: 245 mg/dL — ABNORMAL HIGH (ref 70–99)
Potassium: 4.8 mmol/L (ref 3.5–5.1)
Sodium: 140 mmol/L (ref 135–145)

## 2020-10-11 LAB — CBC
HCT: 40.4 % (ref 36.0–46.0)
Hemoglobin: 13.8 g/dL (ref 12.0–15.0)
MCH: 30.7 pg (ref 26.0–34.0)
MCHC: 34.2 g/dL (ref 30.0–36.0)
MCV: 89.8 fL (ref 80.0–100.0)
Platelets: 202 10*3/uL (ref 150–400)
RBC: 4.5 MIL/uL (ref 3.87–5.11)
RDW: 12.7 % (ref 11.5–15.5)
WBC: 8.3 10*3/uL (ref 4.0–10.5)
nRBC: 0 % (ref 0.0–0.2)

## 2020-10-11 LAB — CBG MONITORING, ED: Glucose-Capillary: 214 mg/dL — ABNORMAL HIGH (ref 70–99)

## 2020-10-11 MED ORDER — GABAPENTIN 300 MG PO CAPS
300.0000 mg | ORAL_CAPSULE | Freq: Once | ORAL | Status: AC
  Administered 2020-10-11: 300 mg via ORAL
  Filled 2020-10-11: qty 1

## 2020-10-11 NOTE — ED Triage Notes (Signed)
Pt presents to ER c/o pain shooting up right arm, into neck, along with near syncopal episodes x last 3 days.  Pt A&Ox4 at this time.  Pt has not passed out yet but feels like she is going too. Pt is a diabetic and has not been checking her BS regularly.

## 2020-10-11 NOTE — ED Provider Notes (Signed)
[facility] Emergency Department Provider Note   ____________________________________________   I have reviewed the triage vital signs and the nursing notes.   HISTORY  Chief Complaint Near Syncope   History limited by: Language [facility] Interpreter utilized   HPI Pamela Rios is a 75 y.o. female who presents to the emergency department today because of concerns for lightheadedness and weakness.  Patient states symptoms have been going on for the past 3 days.  She does notice it more when she is up walking.  Patient denies any associated chest pain palpitations or shortness of breath.  In addition to the dizziness she also is complaining of right neck pain that shoots down her right arm into her right hand.  This has been going on for the past few weeks.  Patient states she had similar pain roughly a year ago that resolved after a few months.  Patient denies any recent trauma falls or unusual activity with that arm.   Records reviewed. Per medical record review patient has a history of DM, HLD, HTN.   Past Medical History:  Diagnosis Date  . DDD (degenerative disc disease), cervical   . Diabetes mellitus without complication (HCC)   . Diverticulitis   . Hyperlipidemia   . Hypertension   . Polyradiculitis 2019   cervical, since at least 2019    Patient Active Problem List   Diagnosis Date Noted  . Diverticulitis 01/22/2019    Past Surgical History:  Procedure Laterality Date  . KIDNEY STONE SURGERY    . KNEE SURGERY Right     Prior to Admission medications   Medication Sig Start Date End Date Taking? Authorizing Provider  albuterol (VENTOLIN HFA) 108 (90 Base) MCG/ACT inhaler Inhale 2 puffs into the lungs every 4 (four) hours as needed for wheezing. 08/30/18   [provider]  albuterol (VENTOLIN HFA) 108 (90 Base) MCG/ACT inhaler 2 puffs every 6 hours for 24 hours, then ever 4-6 hours as needed for wheezing 08/11/20   Shaune Pollack, MD  amoxicillin-clavulanate (AUGMENTIN) 875-125 MG tablet Take 1 tablet by mouth every 12 (twelve) hours. 01/24/19   Enedina Finner, MD  diclofenac (VOLTAREN) 75 MG EC tablet Take 75 mg twice daily with food for 1 month, then decrease to 75 mg once daily for 1 month, then discontinue.  No over the counter pain relievers except Tylenol 05/22/19   [provider]  gabapentin (NEURONTIN) 300 MG capsule Take by mouth.    [provider]  HYDROcodone-acetaminophen (NORCO/VICODIN) 5-325 MG tablet Take 2 tablets by mouth every 6 (six) hours as needed for moderate pain or severe pain. 06/24/19   Loleta Rose, MD  lisinopril (ZESTRIL) 5 MG tablet Take 5 mg by mouth daily.  12/27/18   [provider]  metFORMIN (GLUCOPHAGE) 500 MG tablet Take 500 mg by mouth daily with breakfast.  12/27/18   [provider]  simvastatin (ZOCOR) 20 MG tablet Take 20 mg by mouth daily at 6 PM.     [provider]    Allergies Sulfa antibiotics  Family History  Problem Relation Age of Onset  . Breast cancer Mother   . CVA Mother   . Diabetes Mother   . Hypertension Mother   . CAD Father     Social History Social History   Tobacco Use  . Smoking status: Never Smoker  . Smokeless tobacco: Never Used  Vaping Use  . Vaping Use: Never used  Substance Use Topics  . Alcohol  use: Not Currently  . Drug use: Never    Review of Systems Constitutional: No fever/chills Eyes: No visual changes. ENT: No sore throat. Cardiovascular: Denies chest pain. Respiratory: Denies shortness of breath. Gastrointestinal: No abdominal pain.  No nausea, no vomiting.  No diarrhea.   Genitourinary: Negative for dysuria. Musculoskeletal: Positive for neck and right arm pain. Skin: Negative for rash. Neurological: Positive for lightheadedness.  ____________________________________________   PHYSICAL EXAM:  VITAL SIGNS: ED Triage Vitals  Enc Vitals Group     BP 10/11/20 2109  123/66     Pulse Rate 10/11/20 2109 96     Resp 10/11/20 2109 17     Temp 10/11/20 2109 98.3 F (36.8 C)     Temp Source 10/11/20 2109 Oral     SpO2 10/11/20 2109 96 %     Weight 10/11/20 2110 155 lb (70.3 kg)     Height 10/11/20 2110 5\' 3"  (1.6 m)     Head Circumference --      Peak Flow --      Pain Score 10/11/20 2109 9   Constitutional: Alert and oriented.  Eyes: Conjunctivae are normal.  ENT      Head: Normocephalic and atraumatic.      Nose: No congestion/rhinnorhea.      Mouth/Throat: Mucous membranes are moist.      Neck: No stridor. Hematological/Lymphatic/Immunilogical: No cervical lymphadenopathy. Cardiovascular: Normal rate, regular rhythm.  No murmurs, rubs, or gallops.  Respiratory: Normal respiratory effort without tachypnea nor retractions. Breath sounds are clear and equal bilaterally. No wheezes/rales/rhonchi. Gastrointestinal: Soft and non tender. No rebound. No guarding.  Genitourinary: Deferred Musculoskeletal: Normal range of motion in all extremities. No lower extremity edema. Neurologic:  Normal speech and language. No gross focal neurologic deficits are appreciated.  Skin:  Skin is warm, dry and intact. No rash noted. Psychiatric: Mood and affect are normal. Speech and behavior are normal. Patient exhibits appropriate insight and judgment.  ____________________________________________    LABS (pertinent positives/negatives)  CBC wbc 8.3, hgb 13.8, plt 202 BMP wnl except glu 245  ____________________________________________   EKG  I, 2110, attending physician, personally viewed and interpreted this EKG  EKG Time: 2123 Rate: 90 Rhythm: normal sinus rhythm Axis: left axis deviation Intervals: qtc 481 QRS: LBBB ST changes: no st elevation equivalent Impression: abnormal ekg ____________________________________________     RADIOLOGY  None  ____________________________________________   PROCEDURES  Procedures  ____________________________________________   INITIAL IMPRESSION / ASSESSMENT AND PLAN / ED COURSE  Pertinent labs & imaging results that were available during my care of the patient were reviewed by me and considered in my medical decision making (see chart for details).   Patient presented to the emergency department today because of concern for lightheadedness as well as right neck and arm pain. In terms of the neck and arm pain I do think cervical radiculopathy is likely. Patient was seen for similar symptoms roughly a year and a half ago and it was thought to be radiculopathy at that time. It did improve after a couple of months. Will try a gabapentin here. In terms of the lightheadedness will check blood work and urine. Will get a troponin. If blood work negative and patient feels improved do think it would be reasonable for discharge home to follow up as an outpatient.    ____________________________________________   FINAL CLINICAL IMPRESSION(S) / ED DIAGNOSES  Final diagnoses:  Cervical radiculopathy     Note: This dictation was prepared with Dragon dictation. Any transcriptional  errors that result from this process are unintentional     Phineas Semen, MD 10/11/20 2344

## 2020-10-12 LAB — URINALYSIS, COMPLETE (UACMP) WITH MICROSCOPIC
Bilirubin Urine: NEGATIVE
Glucose, UA: >=500 mg/dL — AB
Hgb urine dipstick: NEGATIVE
Ketones, ur: NEGATIVE mg/dL
Nitrite: NEGATIVE
Protein, ur: NEGATIVE mg/dL
Specific Gravity, Urine: 1.026 (ref 1.005–1.030)
pH: 5 (ref 5.0–8.0)

## 2020-10-12 LAB — TROPONIN I (HIGH SENSITIVITY): Troponin I (High Sensitivity): 5 ng/L (ref <18)

## 2020-10-12 MED ORDER — OXYCODONE HCL 5 MG PO TABS: 5.0000 mg | ORAL_TABLET | Freq: Every evening | ORAL | 0 refills | Status: AC | PRN

## 2020-10-12 MED ORDER — OXYCODONE HCL 5 MG PO TABS
5.0000 mg | ORAL_TABLET | Freq: Once | ORAL | Status: AC
  Administered 2020-10-12: 5 mg via ORAL
  Filled 2020-10-12: qty 1

## 2020-10-12 MED ORDER — OXYCODONE HCL 5 MG PO TABS: 5.0000 mg | ORAL_TABLET | Freq: Two times a day (BID) | ORAL | 0 refills | Status: DC | PRN

## 2020-10-12 MED ORDER — GABAPENTIN 300 MG PO CAPS: 300.0000 mg | ORAL_CAPSULE | Freq: Three times a day (TID) | ORAL | 0 refills | Status: AC

## 2020-10-12 NOTE — ED Provider Notes (Addendum)
12:00 AM Assumed care for off going team.   Blood pressure (!) 128/55, pulse 77, temperature 98.3 F (36.8 C), temperature source Oral, resp. rate 15, height 5\' 3"  (1.6 m), weight 70.3 kg, SpO2 97 %.  See their HPI for full report but in brief patient pending UA and troponin and if negative discharge home.  Urine is negative and troponin is negative.  On repeat evaluation patient still having some pain in the right shoulder.  On a review of records patient does have extensive work-up previously for this and thought to be a cervical radiculopathy.  She had MRI in 2020.  At this time she got good grip strength that is equal and equal sensation just has some of the pain.  Can requesting something to help with pain and sleep.  They stated that oxycodone has worked in the past.  I explained my concern with using oxycodone for chronic pain and the fact that it could cause falls.  Family is aware but states that is helped her in the past with sleep and with the pain and they are requesting something to help until they can get follow-up with her PCP.  Therefore I will give him 1 dose of oxycodone them and send him home with 3 oxycodone pills and a prescription for the gabapentin.  I reviewed the database and there was no recent fills.  Family expressed understanding and felt comfortable with this plan.              2021, MD 10/12/20 [phone]

## 2020-10-12 NOTE — Discharge Instructions (Addendum)
Take Tylenol 1 g every 8 hours to help with pain and ibuprofen 600 every 6 hours with food.  Take the gabapentin as well to help with nerve pain.  Take the oxycodone only at nighttime  Follow-up with your PCP to further discuss management.  Take oxycodone as prescribed. Do not drink alcohol, drive or participate in any other potentially dangerous activities while taking this medication as it may make you sleepy. Do not take this medication with any other sedating medications, either prescription or over-the-counter. If you were prescribed Percocet or Vicodin, do not take these with acetaminophen (Tylenol) as it is already contained within these medications.  This medication is an opiate (or narcotic) pain medication and can be habit forming. Use it as little as possible to achieve adequate pain control. Do not use or use it with extreme caution if you have a history of opiate abuse or dependence. If you are on a pain contract with your primary care doctor or a pain specialist, be sure to let them know you were prescribed this medication today from the [facility] Emergency Department. This medication is intended for your use only - do not give any to anyone else and keep it in a secure place where nobody else, especially children, have access to it.

## 2021-09-01 IMAGING — CR DG CHEST 2V
2 series · 2 of 2 positions shown · non-contrast
Comparison: 06/21/2019

CLINICAL DATA: Chest pain short of breath dizziness

EXAM:
CHEST - 2 VIEW

[chest lat]
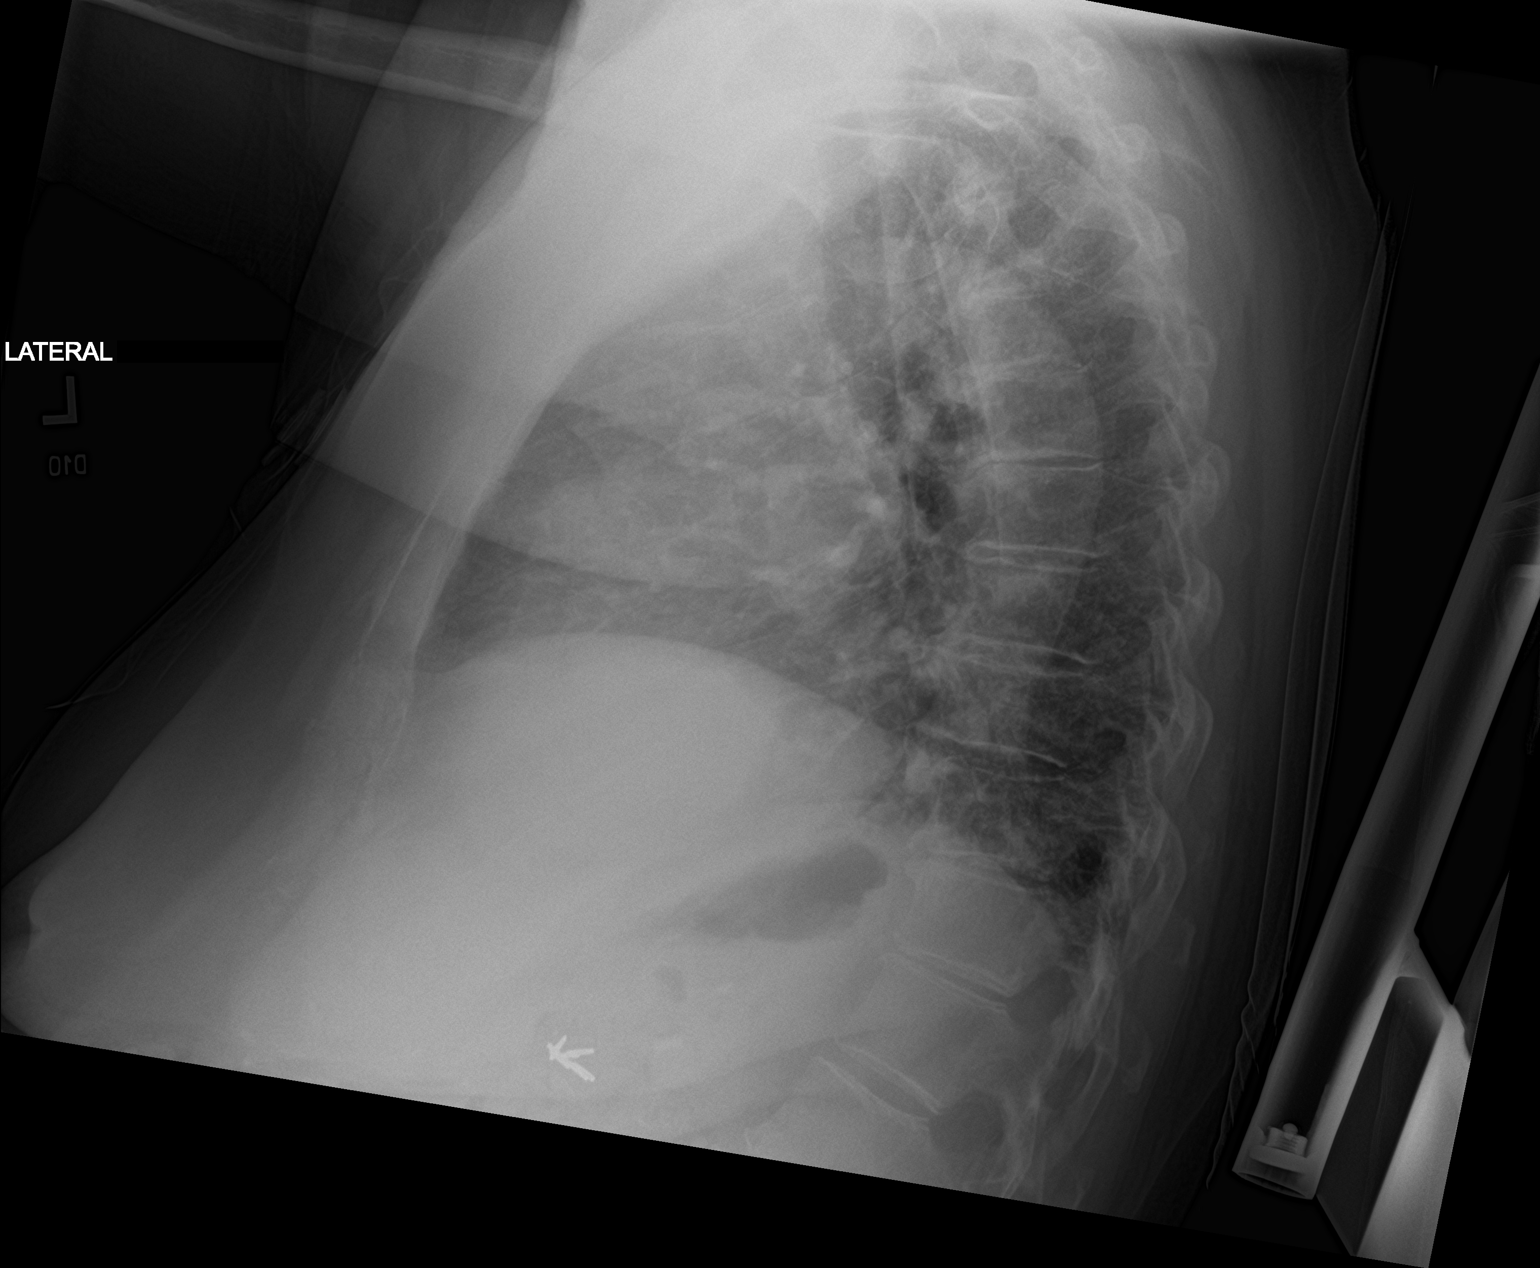

[chest ap]
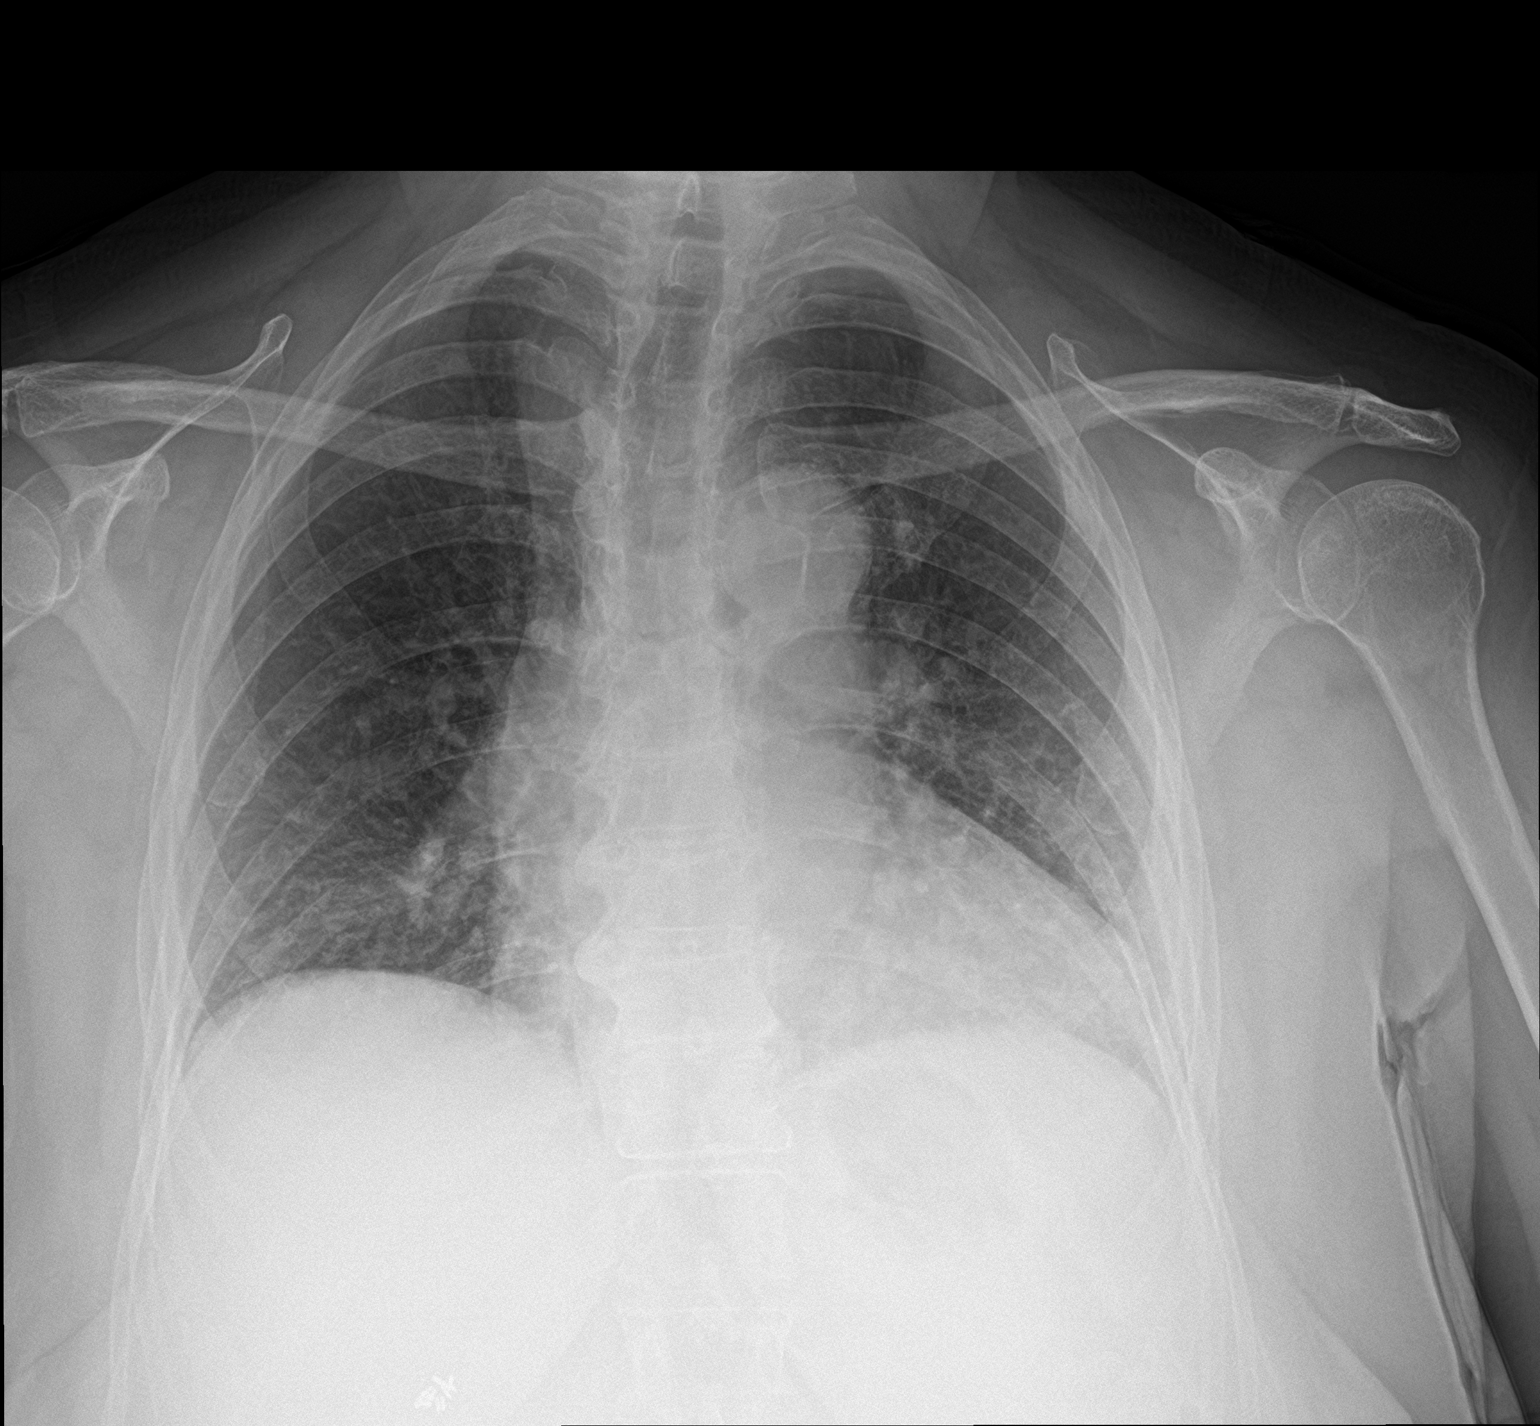

[2 of 2 positions shown; findings below may reference images not displayed]

FINDINGS: Mild cardiac enlargement.  Negative for heart failure.

Mild bibasilar atelectasis. Negative for effusion. No acute skeletal
abnormality.
IMPRESSION: Mild bibasilar atelectasis.

## 2021-09-01 IMAGING — CT CT ANGIO CHEST
2 of 6 series · 17 of 46 positions shown · IV contrast (APPLIED)
Comparison: Radiograph 08/11/2020

CLINICAL DATA: Chest pain and shortness of breath, PE suspected

EXAM:
CT ANGIOGRAPHY CHEST WITH CONTRAST
TECHNIQUE: Multidetector CT imaging of the chest was performed using the
standard protocol during bolus administration of intravenous
contrast. Multiplanar CT image reconstructions and MIPs were
obtained to evaluate the vascular anatomy.
CONTRAST:  75mL OMNIPAQUE IOHEXOL 350 MG/ML SOLN

[Series 5: thins · axial · 0.63mm/px · z∈[-307,-90]mm · 14 of 239 slices shown]
[im 11/239  lung]
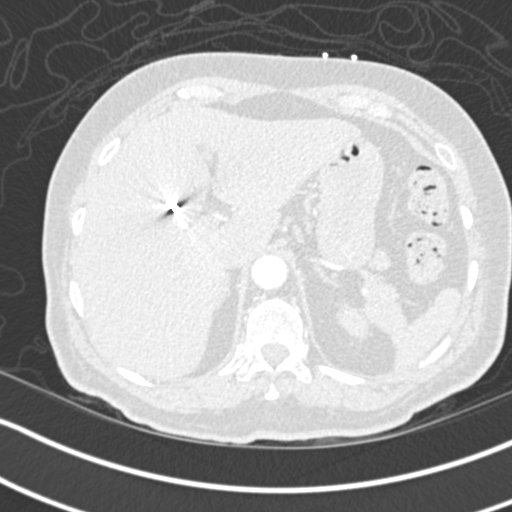
[im 32/239  soft-tissue]
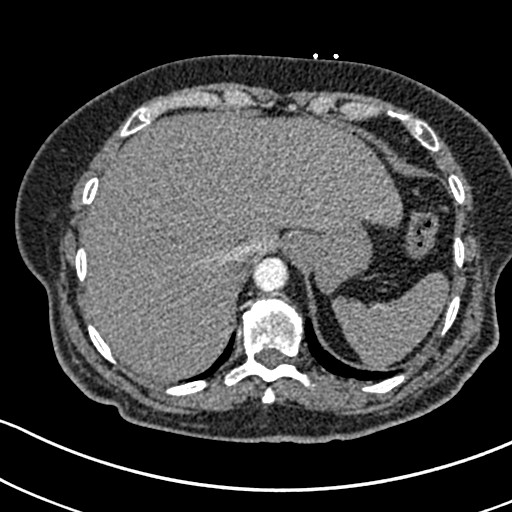
[im 42/239  lung]
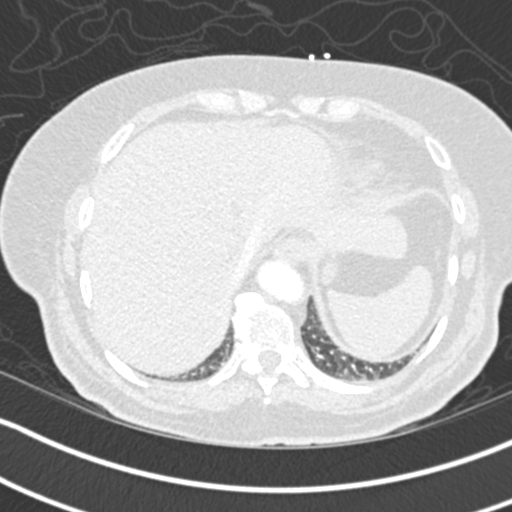
[im 63/239  soft-tissue]
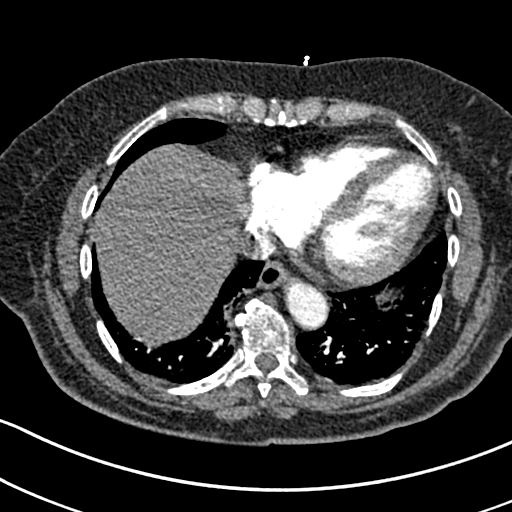
[im 83/239  lung]
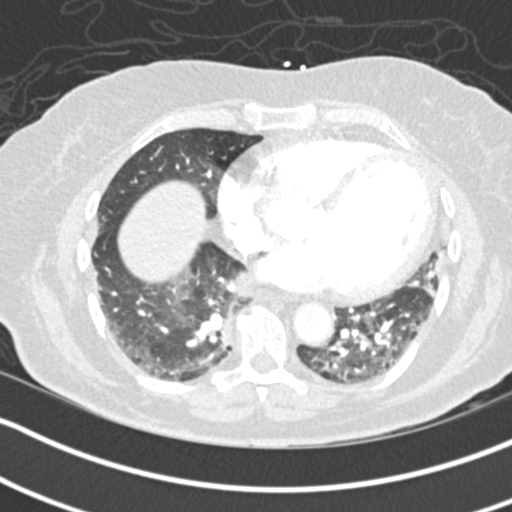
[im 94/239  soft-tissue]
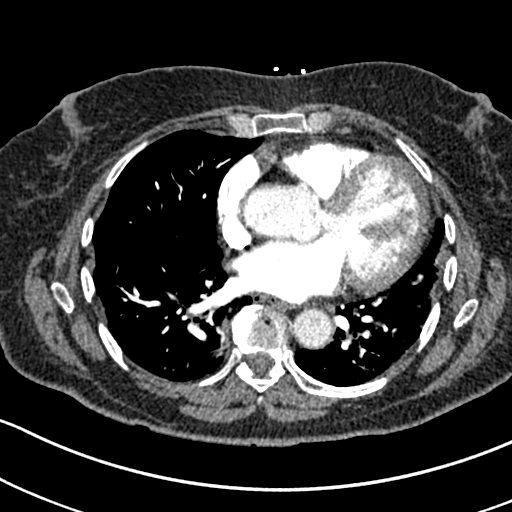
[im 114/239  lung]
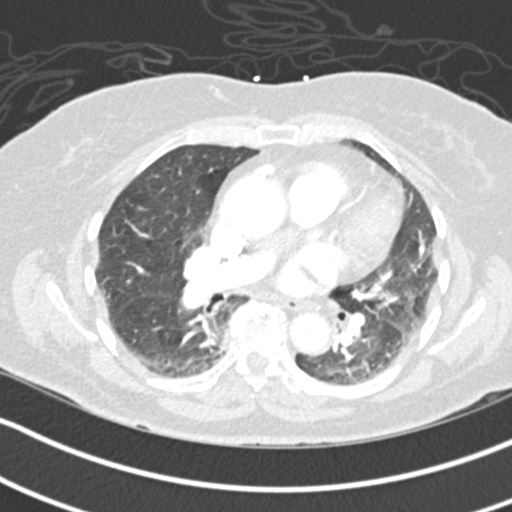
[im 125/239  soft-tissue]
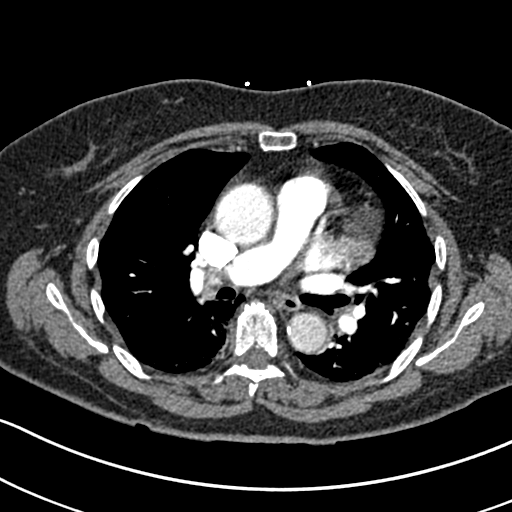
[im 145/239  lung]
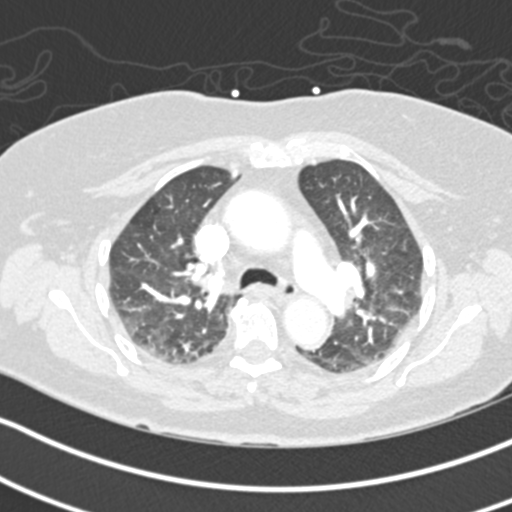
[im 156/239  soft-tissue]
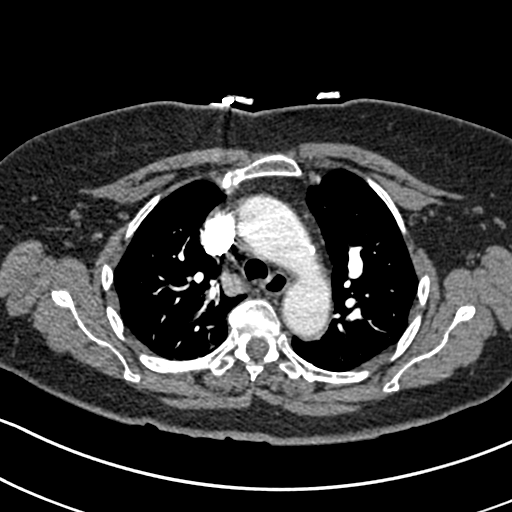
[im 176/239  lung]
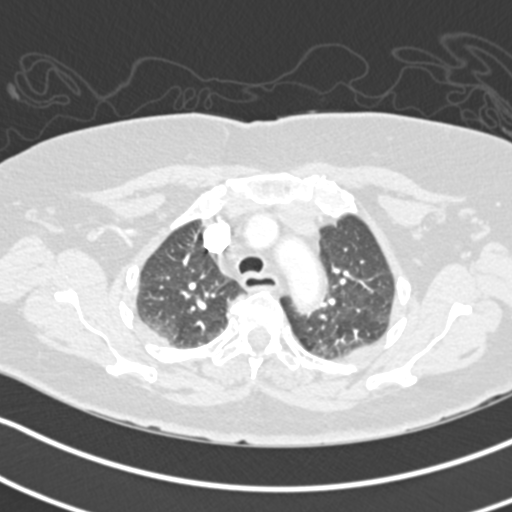
[im 197/239  soft-tissue]
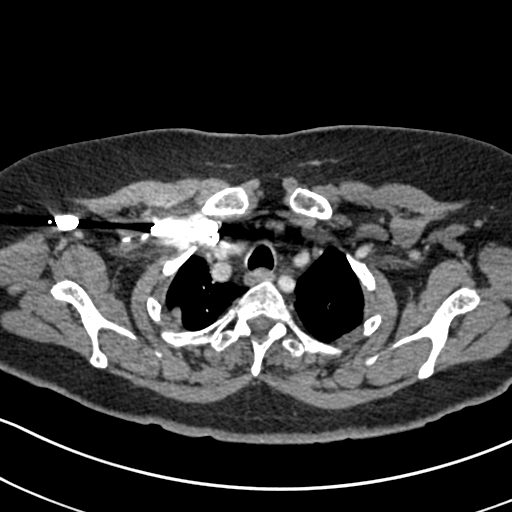
[im 207/239  lung]
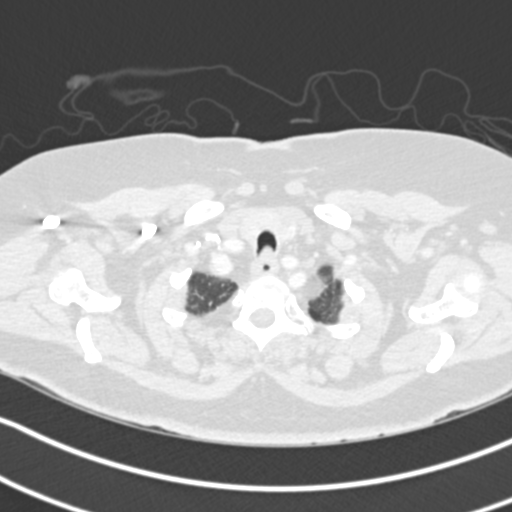
[im 228/239  soft-tissue]
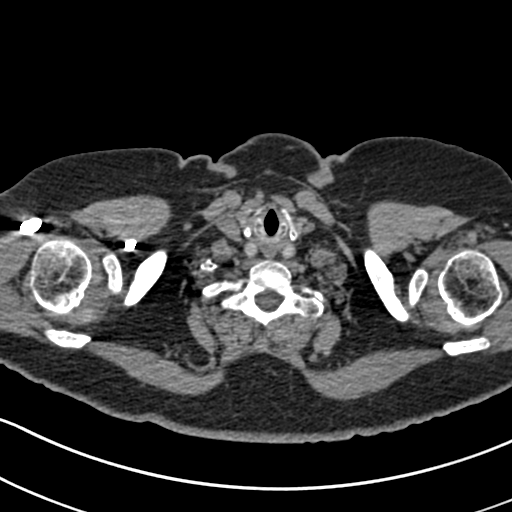

[Series 7: coronal mpr · coronal · 0.49mm/px · 3 of 79 slices shown]
[im 20/79  soft-tissue]
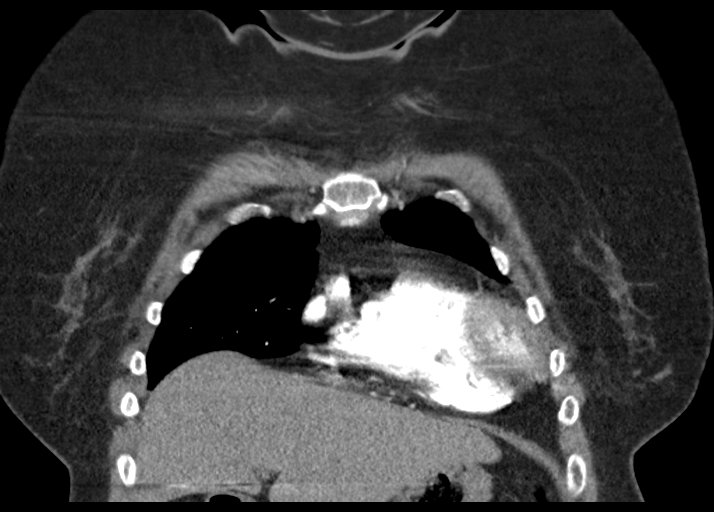
[im 40/79  soft-tissue]
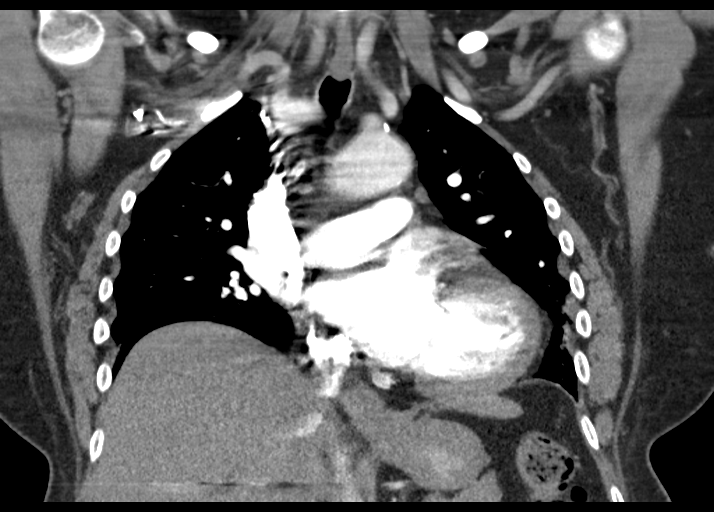
[im 59/79  soft-tissue]
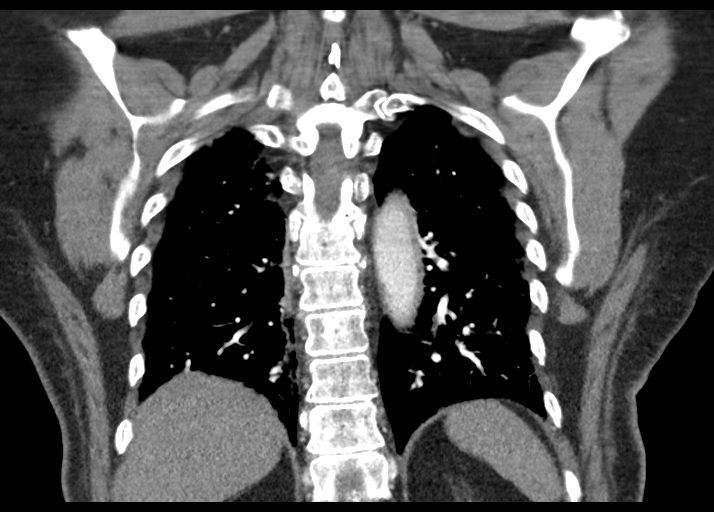

[17 of 46 positions shown; findings below may reference images not displayed]

FINDINGS: Cardiovascular: Satisfactory opacification the pulmonary arteries to
the segmental level. No pulmonary artery filling defects are
identified. Central pulmonary arteries are normal caliber. Normal
heart size. No pericardial effusion. No gross acute aortic
abnormality. No periaortic stranding or hemorrhage. Thoracic aorta
is top-normal caliber. Shared origin of the brachiocephalic and left
common carotid arteries. Minimal calcification in the proximal great
vessels. No major venous abnormalities.

Mediastinum/Nodes: No mediastinal fluid or gas. Normal thyroid gland
and thoracic inlet. No acute abnormality of the trachea or
esophagus. No worrisome mediastinal, hilar or axillary adenopathy.

Lungs/Pleura: Low lung volumes and atelectatic changes. Additional
areas of mosaic attenuation may reflect some further volume loss
accentuated by imaging during exhalation versus air trapping or
small airways disease. Subpleural 5 mm nodule in the posterior
segment right upper lobe ([DATE]). Additional small 3 and 4 mm nodule
seen in the right middle lobe (6/50, forty-eight). No other
concerning pulmonary nodules or masses.

Upper Abdomen: Prior cholecystectomy. No acute abnormalities present
in the visualized portions of the upper abdomen.

Musculoskeletal: Degenerative changes are present in the imaged
spine and shoulders. Mild dextrocurvature of the thoracic levels. No
acute osseous abnormality or suspicious osseous lesion. No worrisome
chest wall masses or lesions.

Review of the MIP images confirms the above findings.
IMPRESSION: 1. No evidence of pulmonary embolism.
2. Low lung volumes and atelectatic changes. Additional areas of
mosaic attenuation may reflect some further volume loss accentuated
by imaging during exhalation versus air trapping or small airways
disease.
3. Multiple right-sided pulmonary nodules measuring up to 5 mm in
the posterior segment right upper lobe. No follow-up needed if
patient is low-risk (and has no known or suspected primary
neoplasm). Non-contrast chest CT can be considered in 12 months if
patient is high-risk. This recommendation follows the consensus
statement: Guidelines for Management of Incidental Pulmonary Nodules
Detected on CT Images: From the [HOSPITAL] 8010; Radiology
